# Patient Record
Sex: Male | Born: 1990 | Race: White | Hispanic: No | Marital: Single | Smoking: Never smoker
Health system: Southern US, Community
[De-identification: ages and names within clinical notes are randomized; demographics above are authoritative.]

## PROBLEM LIST (undated history)

## (undated) ENCOUNTER — Emergency Department: Discharge status: Absent without leave | Payer: Managed Care, Other (non HMO)

## (undated) DIAGNOSIS — F419 Anxiety disorder, unspecified: Secondary | ICD-10-CM

## (undated) DIAGNOSIS — I10 Essential (primary) hypertension: Secondary | ICD-10-CM

## (undated) DIAGNOSIS — R52 Pain, unspecified: Secondary | ICD-10-CM

## (undated) DIAGNOSIS — G4730 Sleep apnea, unspecified: Secondary | ICD-10-CM

## (undated) DIAGNOSIS — E669 Obesity, unspecified: Secondary | ICD-10-CM

## (undated) HISTORY — PX: KNEE ARTHROSCOPY: SUR90

## (undated) HISTORY — PX: TYMPANOSTOMY TUBE PLACEMENT: SHX32

---

## 2015-08-11 ENCOUNTER — Encounter: Payer: Self-pay | Admitting: Physician Assistant

## 2015-08-11 ENCOUNTER — Ambulatory Visit: Payer: Self-pay | Admitting: Physician Assistant

## 2015-08-11 VITALS — BP 120/80 | HR 99 | Temp 98.8°F

## 2015-08-11 DIAGNOSIS — J018 Other acute sinusitis: Secondary | ICD-10-CM

## 2015-08-11 MED ORDER — AMOXICILLIN-POT CLAVULANATE 875-125 MG PO TABS: 1.0000 | ORAL_TABLET | Freq: Two times a day (BID) | ORAL | Status: DC

## 2015-08-11 NOTE — Progress Notes (Signed)
S: C/o runny nose and congestion with sore throat and sinus headache for 3 days, ?fever, chills yesterday, none today, denies cp/sob, v/d; mucus is green and thick, cough is sporadic and just started this am, c/o of facial and dental pain.   O: PE: vitals wnl, nad, perrl eomi, normocephalic, tms dull, nasal mucosa red and swollen, throat injected, neck supple no lymph, lungs c t a, cv rrr, neuro intact  A:  Acute sinusitis   P: augmentin  bid x 10d, drink fluids, continue regular meds , use otc meds of choice, return if not improving in 5 days, return earlier if worsening

## 2015-11-28 ENCOUNTER — Emergency Department: Payer: Managed Care, Other (non HMO)

## 2015-11-28 ENCOUNTER — Emergency Department
Admission: EM | Admit: 2015-11-28 | Discharge: 2015-11-29 | Disposition: A | Discharge status: Home / Self Care | Payer: Managed Care, Other (non HMO) | Attending: Emergency Medicine | Admitting: Emergency Medicine

## 2015-11-28 ENCOUNTER — Encounter: Payer: Self-pay | Admitting: Emergency Medicine

## 2015-11-28 DIAGNOSIS — I10 Essential (primary) hypertension: Secondary | ICD-10-CM | POA: Insufficient documentation

## 2015-11-28 DIAGNOSIS — R000 Tachycardia, unspecified: Secondary | ICD-10-CM | POA: Diagnosis present

## 2015-11-28 HISTORY — DX: Essential (primary) hypertension: I10

## 2015-11-28 LAB — CBC WITH DIFFERENTIAL/PLATELET
Basophils Absolute: 0.1 10*3/uL (ref 0–0.1)
Basophils Relative: 0 %
Eosinophils Absolute: 0.1 10*3/uL (ref 0–0.7)
HEMATOCRIT: 44.2 % (ref 40.0–52.0)
Hemoglobin: 14.8 g/dL (ref 13.0–18.0)
LYMPHS ABS: 3.3 10*3/uL (ref 1.0–3.6)
MCH: 29.4 pg (ref 26.0–34.0)
MCHC: 33.4 g/dL (ref 32.0–36.0)
MCV: 88.1 fL (ref 80.0–100.0)
MONO ABS: 0.6 10*3/uL (ref 0.2–1.0)
Monocytes Relative: 5 %
Neutro Abs: 8.1 10*3/uL — ABNORMAL HIGH (ref 1.4–6.5)
Neutrophils Relative %: 67 %
PLATELETS: 259 10*3/uL (ref 150–440)
RBC: 5.01 MIL/uL (ref 4.40–5.90)
RDW: 13.9 % (ref 11.5–14.5)
WBC: 12.1 10*3/uL — ABNORMAL HIGH (ref 3.8–10.6)

## 2015-11-28 LAB — BASIC METABOLIC PANEL
ANION GAP: 10 (ref 5–15)
BUN: 11 mg/dL (ref 6–20)
CALCIUM: 8.9 mg/dL (ref 8.9–10.3)
CHLORIDE: 107 mmol/L (ref 101–111)
CO2: 23 mmol/L (ref 22–32)
Creatinine, Ser: 1.03 mg/dL (ref 0.61–1.24)
GFR calc Af Amer: >60 mL/min (ref >60)
GFR calc non Af Amer: >60 mL/min (ref >60)
GLUCOSE: 167 mg/dL — AB (ref 65–99)
Potassium: 3.7 mmol/L (ref 3.5–5.1)
Sodium: 140 mmol/L (ref 135–145)

## 2015-11-28 LAB — TSH: TSH: 3.411 u[IU]/mL (ref 0.350–4.500)

## 2015-11-28 LAB — TROPONIN I: Troponin I: <0.03 ng/mL (ref <0.031)

## 2015-11-28 MED ORDER — SODIUM CHLORIDE 0.9 % IV BOLUS (SEPSIS)
1000.0000 mL | Freq: Once | INTRAVENOUS | Status: AC
  Administered 2015-11-28: 1000 mL via INTRAVENOUS

## 2015-11-28 MED ORDER — LORAZEPAM 2 MG/ML IJ SOLN
0.5000 mg | Freq: Once | INTRAMUSCULAR | Status: AC
  Administered 2015-11-28: 0.5 mg via INTRAVENOUS
  Filled 2015-11-28: qty 1

## 2015-11-28 NOTE — ED Notes (Signed)
Pt states he is having a rapid heart rate, sweating, headache, denies dizziness, nausea. States his rate elevated  Suddenly, bp elevated

## 2015-11-28 NOTE — ED Provider Notes (Signed)
Time Seen: Approximately *2210  I have reviewed the triage notes  Chief Complaint: Tachycardia   History of Present Illness: Cesar Braun is a 25 y.o. 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 He states he's been working outside and had a very busy day today and then felt sudden onset of a rapid heart rate, sweating, headache and feeling of lightheadedness. He states he has a history of cluster headaches. He also has a history of hypertension and states he has been taking his lisinopril. He denies any cardiac stimulating medication. He denies any heavy caffeine or illicit drugs. He has had a history of panic attacks in the past.   Past Medical History  Diagnosis Date  . Hypertension     There are no active problems to display for this patient.   Past Surgical History  Procedure Laterality Date  . Knee arthroscopy Right     Past Surgical History  Procedure Laterality Date  . Knee arthroscopy Right     Current Outpatient Rx  Name  Route  Sig  Dispense  Refill  . lisinopril (PRINIVIL,ZESTRIL) 10 MG tablet   Oral   Take 10 mg by mouth daily.           Allergies:  Review of patient's allergies indicates no known allergies.  Family History: No family history on file.  Social History: Social History  Substance Use Topics  . Smoking status: Never Smoker   . Smokeless tobacco: None  . Alcohol Use: 4.2 oz/week    0 Standard drinks or equivalent, 7 Cans of beer per week     Review of Systems:   10 point review of systems was performed and was otherwise negative:  Constitutional: No fever Eyes: No visual disturbances ENT: No sore throat, ear pain Cardiac: No chest pain Respiratory: No shortness of breath, wheezing, or stridor Abdomen: No abdominal pain, no vomiting, No diarrhea Endocrine: No weight loss, No night sweats Extremities: No peripheral edema, cyanosis Skin: No rashes, easy bruising Neurologic: No focal weakness, trouble with speech or  swollowing Urologic: No dysuria, Hematuria, or urinary frequency  Physical Exam:  ED Triage Vitals  Enc Vitals Group     BP 11/28/15 2154 159/113 mmHg     Pulse Rate 11/28/15 2154 117     Resp 11/28/15 2154 23     Temp 11/28/15 2154 98.7 F (37.1 C)     Temp Source 11/28/15 2154 Oral     SpO2 11/28/15 2154 97 %     Weight 11/28/15 2154 300 lb (136.079 kg)     Height 11/28/15 2154 5\' 6"  (1.676 m)     Head Cir --      Peak Flow --      Pain Score 11/28/15 2155 0     Pain Loc --      Pain Edu? --      Excl. in GC? --     General: Awake , Alert , and Oriented times 3; GCS 15 Head: Normal cephalic , atraumatic Eyes: Pupils equal , round, reactive to light Nose/Throat: No nasal drainage, patent upper airway without erythema or exudate.  Neck: Supple, Full range of motion, No anterior adenopathy or palpable thyroid masses Lungs: Clear to ascultation without wheezes , rhonchi, or rales Heart: Tachycardic, regular rhythm without murmurs , gallops , or rubs Abdomen: Soft, non tender without rebound, guarding , or rigidity; bowel sounds positive and symmetric in all 4 quadrants. No organomegaly .  Extremities: 2 plus symmetric pulses. No edema, clubbing or cyanosis Neurologic: normal ambulation, Motor symmetric without deficits, sensory intact Skin: warm, dry, no rashes   Labs:   All laboratory work was reviewed including any pertinent negatives or positives listed below:  Labs Reviewed  CBC WITH DIFFERENTIAL/PLATELET - Abnormal; Notable for the following:    WBC 12.1 (*)    Neutro Abs 8.1 (*)    All other components within normal limits  TROPONIN I  BASIC METABOLIC PANEL  TSH    EKG:  ED ECG REPORT I, Jennye Moccasin, the attending physician, personally viewed and interpreted this ECG.  Date: 11/28/2015 EKG Time: 2138 Rate: 119 Rhythm: Sinus tachycardia QRS Axis: normal Intervals: normal ST/T Wave abnormalities: normal Conduction Disturbances: none Narrative  Interpretation: unremarkable Left ventricular hypertrophy No acute ischemic changes    Radiology:    DG Chest 2 View (Final result) Result time: 11/28/15 23:50:45   Final result by Rad Results In Interface (11/28/15 23:50:45)   Narrative:   CLINICAL DATA: Pt states he is having a rapid heart rate, sweating, headache, denies dizziness, nausea. States his rate elevated Suddenly, bp elevated; denies cp shob or cough  EXAM: CHEST 2 VIEW  COMPARISON: 01/19/2011  FINDINGS: The heart size and mediastinal contours are within normal limits. Both lungs are clear. The visualized skeletal structures are unremarkable.  IMPRESSION: No active cardiopulmonary disease.   Electronically Signed By: Burman Nieves M.D. On: 11/28/2015 23:50     I personally reviewed the radiologic studies    ED Course:  Patient showed symptomatic improvement with IV fluid bolus along with low-dose Ativan. Not sure if he was dehydrated or is having anxiety attack but did not appear to have any significant arrhythmias. His cardiac shadow does not show cardiac enlargement. He does not have any findings indicative of Brugada syndrome but does have some minimal criteria for left ventricular hypertrophy. The patient was advised to follow up with his primary physician. He did not have any  syncopal episode.  Assessment:  Sinus tachycardia Hypertension     Plan: * Outpatient management Patient was advised to return immediately if condition worsens. Patient was advised to follow up with their primary care physician or other specialized physicians involved in their outpatient care. The patient and/or family member/power of attorney had laboratory results reviewed at the bedside. All questions and concerns were addressed and appropriate discharge instructions were distributed by the nursing staff.             Jennye Moccasin, MD 11/29/15 [id number]

## 2015-11-29 NOTE — Discharge Instructions (Signed)
Nonspecific Tachycardia Tachycardia is a faster than normal heartbeat (more than 100 beats per minute). In adults, the heart normally beats between 60 and 100 times a minute. A fast heartbeat may be a normal response to exercise or stress. It does not necessarily mean that something is wrong. However, sometimes when your heart beats too fast it may not be able to pump enough blood to the rest of your body. This can result in chest pain, shortness of breath, dizziness, and even fainting. Nonspecific tachycardia means that the specific cause or pattern of your tachycardia is unknown. CAUSES  Tachycardia may be harmless or it may be due to a more serious underlying cause. Possible causes of tachycardia include:  Exercise or exertion.  Fever.  Pain or injury.  Infection.  Loss of body fluids (dehydration).  Overactive thyroid.  Lack of red blood cells (anemia).  Anxiety and stress.  Alcohol.  Caffeine.  Tobacco products.  Diet pills.  Illegal drugs.  Heart disease. SYMPTOMS  Rapid or irregular heartbeat (palpitations).  Suddenly feeling your heart beating (cardiac awareness).  Dizziness.  Tiredness (fatigue).  Shortness of breath.  Chest pain.  Nausea.  Fainting. DIAGNOSIS  Your caregiver will perform a physical exam and take your medical history. In some cases, a heart specialist (cardiologist) may be consulted. Your caregiver may also order:  Blood tests.  Electrocardiography. This test records the electrical activity of your heart.  A heart monitoring test. TREATMENT  Treatment will depend on the likely cause of your tachycardia. The goal is to treat the underlying cause of your tachycardia. Treatment methods may include:  Replacement of fluids or blood through an intravenous (IV) tube for moderate to severe dehydration or anemia.  New medicines or changes in your current medicines.  Diet and lifestyle changes.  Treatment for certain  infections.  Stress relief or relaxation methods. HOME CARE INSTRUCTIONS   Rest.  Drink enough fluids to keep your urine clear or pale yellow.  Do not smoke.  Avoid:  Caffeine.  Tobacco.  Alcohol.  Chocolate.  Stimulants such as over-the-counter diet pills or pills that help you stay awake.  Situations that cause anxiety or stress.  Illegal drugs such as marijuana, phencyclidine (PCP), and cocaine.  Only take medicine as directed by your caregiver.  Keep all follow-up appointments as directed by your caregiver. SEEK IMMEDIATE MEDICAL CARE IF:   You have pain in your chest, upper arms, jaw, or neck.  You become weak, dizzy, or feel faint.  You have palpitations that will not go away.  You vomit, have diarrhea, or pass blood in your stool.  Your skin is cool, pale, and wet.  You have a fever that will not go away with rest, fluids, and medicine. MAKE SURE YOU:   Understand these instructions.  Will watch your condition.  Will get help right away if you are not doing well or get worse.   This information is not intended to replace advice given to you by your health care provider. Make sure you discuss any questions you have with your health care provider.   Document Released: 08/10/2004 Document Revised: 09/25/2011 Document Reviewed: 01/15/2015 Elsevier Interactive Patient Education [employer]2016 [employer].  Please return immediately if condition worsens. Please contact her primary physician or the physician you were given for referral. If you have any specialist physicians involved in her treatment and plan please also contact them. Thank you for using [facility].

## 2015-12-01 ENCOUNTER — Ambulatory Visit: Payer: Self-pay | Admitting: Physician Assistant

## 2016-08-10 DIAGNOSIS — R1032 Left lower quadrant pain: Secondary | ICD-10-CM | POA: Insufficient documentation

## 2016-10-02 ENCOUNTER — Encounter: Payer: Self-pay | Admitting: Physician Assistant

## 2016-10-02 ENCOUNTER — Ambulatory Visit: Payer: Self-pay | Admitting: Physician Assistant

## 2016-10-02 VITALS — BP 121/80 | HR 89 | Temp 97.7°F

## 2016-10-02 DIAGNOSIS — N529 Male erectile dysfunction, unspecified: Secondary | ICD-10-CM

## 2016-10-02 DIAGNOSIS — G4730 Sleep apnea, unspecified: Secondary | ICD-10-CM | POA: Insufficient documentation

## 2016-10-02 DIAGNOSIS — E669 Obesity, unspecified: Secondary | ICD-10-CM | POA: Insufficient documentation

## 2016-10-02 DIAGNOSIS — S76212A Strain of adductor muscle, fascia and tendon of left thigh, initial encounter: Secondary | ICD-10-CM

## 2016-10-02 DIAGNOSIS — I10 Essential (primary) hypertension: Secondary | ICD-10-CM | POA: Insufficient documentation

## 2016-10-02 DIAGNOSIS — F419 Anxiety disorder, unspecified: Secondary | ICD-10-CM | POA: Insufficient documentation

## 2016-10-02 MED ORDER — SILDENAFIL CITRATE 50 MG PO TABS: ORAL_TABLET | ORAL | 2 refills | Status: DC

## 2016-10-02 NOTE — Progress Notes (Signed)
S: c/o groin pain left side, states sometimes feels that there is swelling in the same area, doesn't always hurt, just sometimes with how he sits, no testicular pain, no known injury, also having difficulty maintaining an erection during intercourse, ?if its from anxiety, is really embarrassed by this even though his girlfriend has been supportive, and his ears are clogged up with wax  O: vitals wnl, nad, ear canals + wax at this time, can see a little of the tm, neck supple no lymph, lungs c t a, cv rrr, no hernia noted but inguinal canal is a little tender with testing, testicles wnl, n/v intact, ears irrigated by rma, small amount of wax remaining in canal but am able to see full tm  A: groin strain, no hernia at this time but muscle is weak, cerumen impaction and removal, erectile dysfunction  P: weight loss, core exercises, return if worsening, go to ER if severe pain, sildenifil 50mg  1 or 2 prn sexual intercourse, if this does not help will refer to urology

## 2016-10-03 ENCOUNTER — Emergency Department: Payer: Managed Care, Other (non HMO)

## 2016-10-03 ENCOUNTER — Encounter: Payer: Self-pay | Admitting: Emergency Medicine

## 2016-10-03 ENCOUNTER — Emergency Department
Admission: EM | Admit: 2016-10-03 | Discharge: 2016-10-03 | Disposition: A | Discharge status: Home / Self Care | Payer: Managed Care, Other (non HMO) | Attending: Emergency Medicine | Admitting: Emergency Medicine

## 2016-10-03 DIAGNOSIS — N50819 Testicular pain, unspecified: Secondary | ICD-10-CM

## 2016-10-03 DIAGNOSIS — I10 Essential (primary) hypertension: Secondary | ICD-10-CM | POA: Insufficient documentation

## 2016-10-03 DIAGNOSIS — N50812 Left testicular pain: Secondary | ICD-10-CM | POA: Diagnosis not present

## 2016-10-03 DIAGNOSIS — R1032 Left lower quadrant pain: Secondary | ICD-10-CM

## 2016-10-03 LAB — URINALYSIS, COMPLETE (UACMP) WITH MICROSCOPIC
Bacteria, UA: NONE SEEN
Bilirubin Urine: NEGATIVE
GLUCOSE, UA: NEGATIVE mg/dL
Hgb urine dipstick: NEGATIVE
Ketones, ur: NEGATIVE mg/dL
Leukocytes, UA: NEGATIVE
Nitrite: NEGATIVE
PH: 7 (ref 5.0–8.0)
Protein, ur: NEGATIVE mg/dL
RBC / HPF: NONE SEEN RBC/hpf (ref 0–5)
Specific Gravity, Urine: 1.017 (ref 1.005–1.030)
WBC, UA: NONE SEEN WBC/hpf (ref 0–5)

## 2016-10-03 LAB — CBC WITH DIFFERENTIAL/PLATELET
Basophils Absolute: 0.1 10*3/uL (ref 0–0.1)
Basophils Relative: 1 %
Eosinophils Absolute: 0.1 10*3/uL (ref 0–0.7)
Eosinophils Relative: 1 %
HEMATOCRIT: 43.1 % (ref 40.0–52.0)
Hemoglobin: 14.8 g/dL (ref 13.0–18.0)
LYMPHS PCT: 27 %
Lymphs Abs: 3.8 10*3/uL — ABNORMAL HIGH (ref 1.0–3.6)
MCH: 29.9 pg (ref 26.0–34.0)
MCHC: 34.3 g/dL (ref 32.0–36.0)
MCV: 87.2 fL (ref 80.0–100.0)
MONO ABS: 0.7 10*3/uL (ref 0.2–1.0)
MONOS PCT: 5 %
NEUTROS ABS: 9.6 10*3/uL — AB (ref 1.4–6.5)
Neutrophils Relative %: 66 %
Platelets: 283 10*3/uL (ref 150–440)
RBC: 4.94 MIL/uL (ref 4.40–5.90)
RDW: 14.2 % (ref 11.5–14.5)
WBC: 14.3 10*3/uL — ABNORMAL HIGH (ref 3.8–10.6)

## 2016-10-03 LAB — COMPREHENSIVE METABOLIC PANEL
ALT: 35 U/L (ref 17–63)
AST: 26 U/L (ref 15–41)
Albumin: 4.1 g/dL (ref 3.5–5.0)
Alkaline Phosphatase: 77 U/L (ref 38–126)
Anion gap: 8 (ref 5–15)
BILIRUBIN TOTAL: 1.1 mg/dL (ref 0.3–1.2)
BUN: 10 mg/dL (ref 6–20)
CHLORIDE: 102 mmol/L (ref 101–111)
CO2: 27 mmol/L (ref 22–32)
CREATININE: 0.81 mg/dL (ref 0.61–1.24)
Calcium: 9.1 mg/dL (ref 8.9–10.3)
GLUCOSE: 81 mg/dL (ref 65–99)
Potassium: 3.5 mmol/L (ref 3.5–5.1)
Sodium: 137 mmol/L (ref 135–145)
Total Protein: 7.4 g/dL (ref 6.5–8.1)

## 2016-10-03 LAB — LIPASE, BLOOD: LIPASE: 15 U/L (ref 11–51)

## 2016-10-03 MED ORDER — TRAMADOL HCL 50 MG PO TABS: 50.0000 mg | ORAL_TABLET | Freq: Four times a day (QID) | ORAL | 0 refills | Status: DC | PRN

## 2016-10-03 MED ORDER — IOPAMIDOL (ISOVUE-300) INJECTION 61%
100.0000 mL | Freq: Once | INTRAVENOUS | Status: AC | PRN
  Administered 2016-10-03: 125 mL via INTRAVENOUS
  Filled 2016-10-03: qty 100

## 2016-10-03 MED ORDER — IOPAMIDOL (ISOVUE-300) INJECTION 61%
30.0000 mL | Freq: Once | INTRAVENOUS | Status: AC
  Administered 2016-10-03: 30 mL via ORAL
  Filled 2016-10-03: qty 30

## 2016-10-03 MED ORDER — SODIUM CHLORIDE 0.9 % IV BOLUS (SEPSIS)
1000.0000 mL | Freq: Once | INTRAVENOUS | Status: AC
  Administered 2016-10-03: 1000 mL via INTRAVENOUS

## 2016-10-03 NOTE — ED Notes (Signed)
Patient transported to Ultrasound 

## 2016-10-03 NOTE — ED Triage Notes (Signed)
Pt presents to ED with right sided groin pain; ongoing for about 2 weeks but worse today. [facility] told to come to ED for further evaluation to rule out an strangulated hernia. Denies difficulty urinating or having a bowel movement. Painful with palpation.

## 2016-10-03 NOTE — ED Notes (Signed)
Patient transported to CT 

## 2016-10-03 NOTE — ED Provider Notes (Addendum)
[facility] Emergency Department Provider Note  ____________________________________________   First MD Initiated Contact with Patient 10/03/16 1943     (approximate)  I have reviewed the triage vital signs and the nursing notes.   HISTORY  Chief Complaint Groin Pain   HPI Cesar Braun is a 26 y.o. male With a history of hypertension who is presenting to the emergency department today with left testicular pain was radiating into the left groin. He says that the pain has been ongoing for several weeks and has been associated with the left testicle being high riding. 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 he denies any pain with urination. Says the pain as a 6-7 out of 10 at this time. Denies any nausea vomiting or diarrhea. Says that he has been eating and drinking normally at home over the past several weeks. Denies any trauma to the left testicle. Patient denies any penile discharge. Currently sexually active with one partner.   Past Medical History:  Diagnosis Date  . Hypertension     Patient Active Problem List   Diagnosis Date Noted  . Anxiety 10/02/2016  . HTN (hypertension) 10/02/2016  . Obesity 10/02/2016  . Sleep apnea 10/02/2016  . Abdominal pain, LLQ (left lower quadrant) 08/10/2016    Past Surgical History:  Procedure Laterality Date  . KNEE ARTHROSCOPY Right     Prior to Admission medications   Medication Sig Start Date End Date Taking? Authorizing Provider  Cholecalciferol (VITAMIN D3) 2000 units capsule Take by mouth.    Historical Provider, MD  lisinopril (PRINIVIL,ZESTRIL) 10 MG tablet Take 10 mg by mouth daily.    Historical Provider, MD  sildenafil (VIAGRA) 50 MG tablet Take one or 2 pills prn erectile dysfunction 10/02/16   Faythe Ghee, PA-C    Allergies Patient has no known allergies.  No family history on  file.  Social History Social History  Substance Use Topics  . Smoking status: Never Smoker  . Smokeless tobacco: Never Used  . Alcohol use 4.2 oz/week    7 Cans of beer per week    Review of Systems Constitutional: No fever/chills Eyes: No visual changes. ENT: No sore throat. Cardiovascular: Denies chest pain. Respiratory: Denies shortness of breath. Gastrointestinal: No abdominal pain.  No nausea, no vomiting.  No diarrhea.  No constipation. Genitourinary: Negative for dysuria. Musculoskeletal: Negative for back pain. Skin: Negative for rash. Neurological: Negative for headaches, focal weakness or numbness.  10-point ROS otherwise negative.  ____________________________________________   PHYSICAL EXAM:  VITAL SIGNS: ED Triage Vitals  Enc Vitals Group     BP 10/03/16 1931 (!) 143/90     Pulse Rate 10/03/16 1931 (!) 113     Resp 10/03/16 1931 18     Temp 10/03/16 1931 98.4 F (36.9 C)     Temp Source 10/03/16 1931 Oral     SpO2 10/03/16 1931 100 %     Weight 10/03/16 1931 300 lb (136.1 kg)     Height 10/03/16 1931 5\' 7"  (1.702 m)     Head Circumference --      Peak Flow --      Pain Score 10/03/16 1932 7     Pain Loc --      Pain Edu? --      Excl. in GC? --     Constitutional: Alert and oriented. Well appearing  and in no acute distress. Eyes: Conjunctivae are normal. PERRL. EOMI. Head: Atraumatic. Nose: No congestion/rhinnorhea. Mouth/Throat: Mucous membranes are moist.  Neck: No stridor.   Cardiovascular: Normal rate, regular rhythm. Grossly normal heart sounds.   Respiratory: Normal respiratory effort.  No retractions. Lungs CTAB. Gastrointestinal: Soft and nontender. No distention.  Genitourinary:  Patient with high riding left testicle but without any definitive horizontal lie of the testicle. Grossly, there are no masses visualized. Circumcised male with normal glands. Also tenderness over the spermatic cord and theleft epididymis.no hernia sacs  palpated. Musculoskeletal: No lower extremity tenderness nor edema.  No joint effusions. Neurologic:  Normal speech and language. No gross focal neurologic deficits are appreciated.  Skin:  Skin is warm, dry and intact. No rash noted. Psychiatric: Mood and affect are normal. Speech and behavior are normal.  ____________________________________________   LABS (all labs ordered are listed, but only abnormal results are displayed)  Labs Reviewed  URINALYSIS, COMPLETE (UACMP) WITH MICROSCOPIC - Abnormal; Notable for the following:       Result Value   Color, Urine YELLOW (*)    APPearance CLEAR (*)    Squamous Epithelial / LPF 0-5 (*)    All other components within normal limits  CBC WITH DIFFERENTIAL/PLATELET - Abnormal; Notable for the following:    WBC 14.3 (*)    Neutro Abs 9.6 (*)    Lymphs Abs 3.8 (*)    All other components within normal limits  COMPREHENSIVE METABOLIC PANEL  LIPASE, BLOOD   ____________________________________________  EKG   ____________________________________________  RADIOLOGY  Pending CAT scan of the abdomen and pelvis.  US Scrotum (Final result)  Result time 10/03/16 20:58:58  Final result by Adrian ProwsKim M Fujinaga, MD (10/03/16 20:58:58)           Narrative:   CLINICAL DATA: Left testicular pain  EXAM: SCROTAL ULTRASOUND  DOPPLER ULTRASOUND OF THE TESTICLES  TECHNIQUE: Complete ultrasound examination of the testicles, epididymis, and other scrotal structures was performed. Color and spectral Doppler ultrasound were also utilized to evaluate blood flow to the testicles.  COMPARISON: None.  FINDINGS: Right testicle  Measurements: 4.1 x 2.3 x 2.9 cm. No mass or microlithiasis visualized.  Left testicle  Measurements: 4 x 2.3 x 2.8 cm. No mass or microlithiasis visualized. Calcifications at the periphery of the left testis possibly related to remote infection or trauma.  Right epididymis: Tiny cyst measuring 1.5 x 2 x 1.4  cm  Left epididymis: Tiny cyst measuring 3.5 by 5.5 x 3.6 mm  Hydrocele: None visualized.  Varicocele: None visualized.  Pulsed Doppler interrogation of both testes demonstrates normal low resistance arterial and venous waveforms bilaterally.  IMPRESSION: No sonographic evidence for testicular torsion. Tiny epididymal head cysts.   Electronically Signed By: Jasmine PangKim Fujinaga M.D. On: 10/03/2016 20:58            ____________________________________________   PROCEDURES  Procedure(s) performed:   Procedures  Critical Care performed:   ____________________________________________   INITIAL IMPRESSION / ASSESSMENT AND PLAN / ED COURSE  Pertinent labs & imaging results that were available during my care of the patient were reviewed by me and considered in my medical decision making (see chart for details). ----------------------------------------- 8 PM on 10/03/2016 ----------------------------------------- Discussed case with Dr. Mena GoesEskridge of urology who would like to see scrotal ultrasound before any further action.  ----------------------------------------- 10:19 PM on 10/03/2016 -----------------------------------------  Urinalysis as well as ultrasound of the scrotum is reassuring. Patient says that his pain is now decreased and is just "uncomfortable."  He says this is the level of pain that he has had for the past several weeks. We will proceed with CAT scan to investigate any hernia that was not detected on the ultrasounds as an abdominal hernia. Patient said he recently took 1 dose of Viagra several days ago for rectal dysfunction but does not have any pain with erections.  Signed out to Dr. Alphonzo Lemmings.      ____________________________________________   FINAL CLINICAL IMPRESSION(S) / ED DIAGNOSES  Final diagnoses:  Pain in left testicle      NEW MEDICATIONS STARTED DURING THIS VISIT:  New Prescriptions   No medications on file     Note:   This document was prepared using Dragon voice recognition software and may include unintentional dictation errors.    Myrna Blazer, MD 10/03/16 1610    Myrna Blazer, MD 10/03/16 2222

## 2016-10-03 NOTE — ED Provider Notes (Signed)
-----------------------------------------   11:38 PM on 10/03/2016 -----------------------------------------   Blood pressure (!) 143/90, pulse (!) 113, temperature 98.4 F (36.9 C), temperature source Oral, resp. rate 18, height 5\' 7"  (1.702 m), weight 300 lb (136.1 kg), SpO2 100 %.  Assuming care from Dr. Alphonzo LemmingsMcShane.  In short, Cesar Braun is a 26 y.o. male with a chief complaint of Groin Pain .  Refer to the original H&P for additional details.  The current plan of care is to follow up the US and the CT scan.   Clinical Course as of Oct 03 2336  Tue Oct 03, 2016  2309 No sonographic evidence for testicular torsion. Tiny epididymal head cysts.   US Scrotum [AW]  2310 Testes symmetric and unremarkable in appearance. Inguinal canals are unremarkable.   CT Abdomen Pelvis W Contrast [AW]    Clinical Course User Index [AW] Rebecka ApleyAllison P Bryanna Yim, MD   The patient's ultrasound showed some tiny epididymal head cyst and the CT is unremarkable. I will discharge the patient to have him follow-up with urology which had been discussed previously. The patient be discharged home.   Rebecka ApleyAllison P Yudit Modesitt, MD 10/03/16 [phone]

## 2016-10-24 ENCOUNTER — Encounter: Payer: Self-pay | Admitting: *Deleted

## 2016-10-25 ENCOUNTER — Encounter
Admission: RE | Disposition: A | Discharge status: Home / Self Care | Payer: Self-pay | Source: Ambulatory Visit | Attending: Unknown Physician Specialty

## 2016-10-25 ENCOUNTER — Ambulatory Visit
Admission: RE | Admit: 2016-10-25 | Discharge: 2016-10-25 | Disposition: A | Discharge status: Home / Self Care | Payer: Managed Care, Other (non HMO) | Source: Ambulatory Visit | Attending: Unknown Physician Specialty | Admitting: Unknown Physician Specialty

## 2016-10-25 ENCOUNTER — Ambulatory Visit: Payer: Managed Care, Other (non HMO) | Admitting: Anesthesiology

## 2016-10-25 ENCOUNTER — Encounter: Payer: Self-pay | Admitting: *Deleted

## 2016-10-25 DIAGNOSIS — K648 Other hemorrhoids: Secondary | ICD-10-CM | POA: Diagnosis not present

## 2016-10-25 DIAGNOSIS — K9289 Other specified diseases of the digestive system: Secondary | ICD-10-CM | POA: Diagnosis not present

## 2016-10-25 DIAGNOSIS — Z79899 Other long term (current) drug therapy: Secondary | ICD-10-CM | POA: Insufficient documentation

## 2016-10-25 DIAGNOSIS — I10 Essential (primary) hypertension: Secondary | ICD-10-CM | POA: Insufficient documentation

## 2016-10-25 DIAGNOSIS — G4730 Sleep apnea, unspecified: Secondary | ICD-10-CM | POA: Insufficient documentation

## 2016-10-25 DIAGNOSIS — R109 Unspecified abdominal pain: Secondary | ICD-10-CM | POA: Diagnosis present

## 2016-10-25 DIAGNOSIS — Z6841 Body Mass Index (BMI) 40.0 and over, adult: Secondary | ICD-10-CM | POA: Insufficient documentation

## 2016-10-25 DIAGNOSIS — R1032 Left lower quadrant pain: Secondary | ICD-10-CM | POA: Insufficient documentation

## 2016-10-25 HISTORY — DX: Sleep apnea, unspecified: G47.30

## 2016-10-25 HISTORY — DX: Obesity, unspecified: E66.9

## 2016-10-25 HISTORY — DX: Anxiety disorder, unspecified: F41.9

## 2016-10-25 HISTORY — DX: Pain, unspecified: R52

## 2016-10-25 SURGERY — SIGMOIDOSCOPY, FLEXIBLE
Anesthesia: General

## 2016-10-25 MED ORDER — PROPOFOL 500 MG/50ML IV EMUL
INTRAVENOUS | Status: AC
  Filled 2016-10-25: qty 50

## 2016-10-25 MED ORDER — LIDOCAINE HCL (PF) 1 % IJ SOLN
INTRAMUSCULAR | Status: AC
  Filled 2016-10-25: qty 2

## 2016-10-25 MED ORDER — SODIUM CHLORIDE 0.9 % IV SOLN: INTRAVENOUS | Status: DC

## 2016-10-25 MED ORDER — MIDAZOLAM HCL 2 MG/2ML IJ SOLN
INTRAMUSCULAR | Status: AC
Start: 2016-10-25 — End: 2016-10-25
  Filled 2016-10-25: qty 2

## 2016-10-25 MED ORDER — SODIUM CHLORIDE 0.9 % IV SOLN
INTRAVENOUS | Status: DC | PRN
  Administered 2016-10-25: 13:00:00 via INTRAVENOUS

## 2016-10-25 MED ORDER — FENTANYL CITRATE (PF) 100 MCG/2ML IJ SOLN
INTRAMUSCULAR | Status: AC
  Filled 2016-10-25: qty 2

## 2016-10-25 NOTE — Transfer of Care (Signed)
Immediate Anesthesia Transfer of Care Note  Patient: Cesar Braun  Procedure(s) Performed: Procedure(s): COLONOSCOPY WITH PROPOFOL (N/A)  Patient Location: Endoscopy Unit  Anesthesia Type:None  Level of Consciousness: awake, alert  and oriented  Airway & Oxygen Therapy: Patient Spontanous Breathing and Patient connected to nasal cannula oxygen  Post-op Assessment: Report given to RN  Post vital signs: Reviewed and stable  Last Vitals:  Vitals:   10/25/16 1257  BP: (!) 145/106  Resp: 18  Temp: 37.2 C    Last Pain:  Vitals:   10/25/16 1257  TempSrc: Tympanic         Complications: No apparent anesthesia complications

## 2016-10-25 NOTE — Anesthesia Postprocedure Evaluation (Signed)
Anesthesia Post Note  Patient: Cesar Braun  Procedure(s) Performed: Procedure(s) (LRB): COLONOSCOPY WITH PROPOFOL (N/A)  Patient location during evaluation: A-ICU Anesthesia plan: No anesthesia given. Level of consciousness: awake and alert Pain management: pain level controlled Vital Signs Assessment: post-procedure vital signs reviewed and stable Respiratory status: spontaneous breathing, nonlabored ventilation, respiratory function stable and patient connected to nasal cannula oxygen Cardiovascular status: stable Postop Assessment: no signs of nausea or vomiting Anesthetic complications: no     Last Vitals:  Vitals:   10/25/16 1257  BP: (!) 145/106  Resp: 18  Temp: 37.2 C    Last Pain:  Vitals:   10/25/16 1257  TempSrc: Tympanic                 Lenard Simmer

## 2016-10-25 NOTE — H&P (Signed)
   Primary Care Physician:  No PCP Per Patient Primary Gastroenterologist:  Dr. Mechele Collin  Pre-Procedure History & Physical: HPI:  Cesar Braun is a 26 y.o. male is here for an flexible sigmoidoscopy.   Past Medical History:  Diagnosis Date  . Anxiety   . Hypertension   . Obesity   . Pain    llq  . Sleep apnea     Past Surgical History:  Procedure Laterality Date  . KNEE ARTHROSCOPY Right   . TYMPANOSTOMY TUBE PLACEMENT Bilateral    very young    Prior to Admission medications   Medication Sig Start Date End Date Taking? Authorizing Provider  Cholecalciferol (VITAMIN D3) 2000 units capsule Take by mouth.   Yes Historical Provider, MD  cyanocobalamin 500 MCG tablet Take 1,000 mcg by mouth daily.   Yes Historical Provider, MD  lisinopril (PRINIVIL,ZESTRIL) 10 MG tablet Take 10 mg by mouth daily.   Yes Historical Provider, MD  sildenafil (VIAGRA) 50 MG tablet Take one or 2 pills prn erectile dysfunction 10/02/16  Yes Faythe Ghee, PA-C  traMADol (ULTRAM) 50 MG tablet Take 1 tablet (50 mg total) by mouth every 6 (six) hours as needed. Patient not taking: Reported on 10/25/2016 10/03/16   Rebecka Apley, MD    Allergies as of 09/07/2016  . (No Known Allergies)    History reviewed. No pertinent family history.  Social History   Social History  . Marital status: Single    Spouse name: N/A  . Number of children: N/A  . Years of education: N/A   Occupational History  . Not on file.   Social History Main Topics  . Smoking status: Never Smoker  . Smokeless tobacco: Never Used  . Alcohol use 4.2 oz/week    7 Cans of beer per week  . Drug use: No  . Sexual activity: Not on file   Other Topics Concern  . Not on file   Social History Narrative  . No narrative on file    Review of Systems: See HPI, otherwise negative ROS  Physical Exam: BP 127/83   Temp 98.9 F (37.2 C) (Tympanic)   Resp 18   Ht  (1.727 m)   Wt 133.8 kg (295 lb)   SpO2 100%   BMI  44.85 kg/m  General:   Alert,  pleasant and cooperative in NAD Head:  Normocephalic and atraumatic. Neck:  Supple; no masses or thyromegaly. Lungs:  Clear throughout to auscultation.    Heart:  Regular rate and rhythm. Abdomen:  Soft, nontender and nondistended. Normal bowel sounds, without guarding, and without rebound.   Neurologic:  Alert and  oriented x4;  grossly normal neurologically.  Impression/Plan: Cesar Braun is here for an flexible sigmoidoscopy to be performed for abdominal pain.  Risks, benefits, limitations, and alternatives regarding  flexible sigmoidoscopy have been reviewed with the patient.  Questions have been answered.  All parties agreeable.   Lynnae Prude, MD  10/25/2016, 4:18 PM

## 2016-10-25 NOTE — Brief Op Note (Signed)
Because of lack of sedation, colonoscopy complete to 50 cms.

## 2016-10-25 NOTE — Brief Op Note (Signed)
Unable to get IV. Karenz inserted 22 in Left AC, but infiltrated upon entry into procedure room. Colonoscopy performed without sedation. Pt. Tolerated well.

## 2016-10-25 NOTE — Op Note (Signed)
[facility] Patient Name: Cesar Braun Procedure Date: 10/25/2016 1:05 PM MRN: [id number] Account #: [id number] Date of Birth: 07-10-1991 Admit Type: Outpatient Age: 26 Room: [facility] ENDO ROOM 3 Gender: Male Note Status: Finalized Procedure:            Colonoscopy Indications:          Abdominal pain in the left lower quadrant Providers:            Scot Jun, MD Complications:        No immediate complications. Procedure:            Pre-Anesthesia Assessment:                       - After reviewing the risks and benefits, the patient                        was deemed in satisfactory condition to undergo the                        procedure.                       After obtaining informed consent, the colonoscope was                        passed under direct vision. Throughout the procedure,                        the patient's blood pressure, pulse, and oxygen                        saturations were monitored continuously. The                        Colonoscope was introduced through the anus and                        advanced to the the sigmoid colon for evaluation. This                        was the intended extent. The colonoscopy was performed                        without difficulty. The patient tolerated the procedure                        well. The quality of the bowel preparation was                        excellent. Findings:      Scope advanced to 50cm in proximal sigmoid colon showing normal mucosa.       At 20cm was a tiny polyp like bump which I removed with jumbo forceps.       Internal hemorrhoids seen. Mucosa throughout the colon looked pink and       healthy. Rectal exam done before the exam was normal.      The perianal and digital rectal examinations were normal. Pertinent       negatives include normal sphincter tone. Impression:           - No specimens collected. Recommendation:       -  Await pathology  results. Scot Jun, MD 10/25/2016 2:08:49 PM This report has been signed electronically. Number of Addenda: 0 Note Initiated On: 10/25/2016 1:05 PM Total Procedure Duration: 0 hours 5 minutes 18 seconds       [facility]

## 2016-10-25 NOTE — Anesthesia Post-op Follow-up Note (Signed)
Anesthesia QCDR form completed.        

## 2016-10-25 NOTE — Anesthesia Preprocedure Evaluation (Addendum)
Anesthesia Evaluation  Patient identified by MRN, date of birth, ID band Patient awake    Reviewed: Allergy & Precautions, H&P , NPO status , Patient's Chart, lab work & pertinent test results, reviewed documented beta blocker date and time   History of Anesthesia Complications Negative for: history of anesthetic complications  Airway Mallampati: III  TM Distance: >3 FB Neck ROM: full    Dental  (+) Teeth Intact, Dental Advidsory Given   Pulmonary neg shortness of breath, sleep apnea , neg COPD, neg recent URI,           Cardiovascular Exercise Tolerance: Good hypertension, (-) angina(-) CAD, (-) Past MI, (-) Cardiac Stents and (-) CABG (-) dysrhythmias (-) Valvular Problems/Murmurs     Neuro/Psych negative neurological ROS  negative psych ROS   GI/Hepatic Neg liver ROS, GERD  ,  Endo/Other  neg diabetesMorbid obesity  Renal/GU negative Renal ROS  negative genitourinary   Musculoskeletal   Abdominal   Peds  Hematology negative hematology ROS (+)   Anesthesia Other Findings Past Medical History: No date: Anxiety No date: Hypertension No date: Obesity No date: Pain     Comment: llq No date: Sleep apnea   Reproductive/Obstetrics negative OB ROS                            Anesthesia Physical Anesthesia Plan  ASA: III  Anesthesia Plan: General   Post-op Pain Management:    Induction:   Airway Management Planned:   Additional Equipment:   Intra-op Plan:   Post-operative Plan:   Informed Consent: I have reviewed the patients History and Physical, chart, labs and discussed the procedure including the risks, benefits and alternatives for the proposed anesthesia with the patient or authorized representative who has indicated his/her understanding and acceptance.   Dental Advisory Given  Plan Discussed with: Anesthesiologist, CRNA and Surgeon  Anesthesia Plan Comments:          Anesthesia Quick Evaluation

## 2016-10-27 LAB — SURGICAL PATHOLOGY

## 2017-05-25 ENCOUNTER — Ambulatory Visit: Payer: Self-pay | Admitting: Physician Assistant

## 2017-05-25 ENCOUNTER — Encounter: Payer: Self-pay | Admitting: Physician Assistant

## 2017-05-25 VITALS — BP 140/100 | HR 99 | Temp 99.0°F

## 2017-05-25 DIAGNOSIS — R21 Rash and other nonspecific skin eruption: Secondary | ICD-10-CM

## 2017-05-25 DIAGNOSIS — L03112 Cellulitis of left axilla: Secondary | ICD-10-CM

## 2017-05-25 MED ORDER — DEXAMETHASONE SODIUM PHOSPHATE 10 MG/ML IJ SOLN
10.0000 mg | Freq: Once | INTRAMUSCULAR | Status: AC
Start: 2017-05-25 — End: 2017-05-25
  Administered 2017-05-25: 10 mg via INTRAMUSCULAR

## 2017-05-25 MED ORDER — CLINDAMYCIN HCL 300 MG PO CAPS: 300.0000 mg | ORAL_CAPSULE | Freq: Three times a day (TID) | ORAL | 0 refills | Status: DC

## 2017-05-25 NOTE — Progress Notes (Signed)
S: Patient complains of a red area under his left arm. He thought it was due to using old spice deodorant. It has had a sticky greenish drainage for several days. The drainage has dried up at this time. The area still very red and sore. Now he has a few bumps spreading onto his chest and saw one on his right forearm. Said he felt cold last night but doesn't know if he had a fever. Does have a sore throat. No cough or congestion. States he does have a little sinus drainage.  O: Vitals show a low-grade temp of 99. Other vitals are normal. Throat is a little irritated on the left side. Neck is supple. Lungs are clear to auscultation. Heart sounds are normal. Skin in the left axilla is red, inflamed, and wrinkled appearing, some small red bumps spreading towards the chest adjacent to the red inflamed area, one small red bump on the right forearm. No active drainage at this time.  A: cellulitis, common cold  P: decadron 10 mg IM, clindamycin 300mg  tid x 7d, pt to recheck on Monday at 8 am

## 2017-05-28 ENCOUNTER — Ambulatory Visit: Payer: Self-pay | Admitting: Physician Assistant

## 2017-05-28 ENCOUNTER — Encounter: Payer: Self-pay | Admitting: Physician Assistant

## 2017-05-28 VITALS — BP 120/90 | HR 105 | Temp 98.2°F | Resp 16

## 2017-05-28 DIAGNOSIS — L03112 Cellulitis of left axilla: Secondary | ICD-10-CM

## 2017-05-28 DIAGNOSIS — J069 Acute upper respiratory infection, unspecified: Secondary | ICD-10-CM

## 2017-05-28 MED ORDER — ALBUTEROL SULFATE HFA 108 (90 BASE) MCG/ACT IN AERS: 2.0000 | INHALATION_SPRAY | Freq: Four times a day (QID) | RESPIRATORY_TRACT | 0 refills | Status: DC | PRN

## 2017-05-28 MED ORDER — METHYLPREDNISOLONE 4 MG PO TBPK: ORAL_TABLET | ORAL | 0 refills | Status: DC

## 2017-05-28 NOTE — Progress Notes (Signed)
S: Patient is here for recheck of cellulitis in the left axilla, states he has taken the medication as prescribed, also states he started with a harsh cough, fever and chills, and body aches over the weekend. States he is coughing up green mucus. He also states he is having wheezing. Denies chest pain or shortness of breath. Did have to call in to work yesterday  O: Vitals with elevated pulse, patient is afebrile at this time, ENT is normal, lungs with decreased breath sounds, CV RRR, cough is dry and hacking, left axilla shows no redness or swelling, cellulitis has resolved.  A: Resolved cellulitis of the left axilla, acute URI  P: Continue the clindamycin, patient instructed to eat yogurt, prescription for Medrol Dosepak and albuterol inhaler sent to the pharmacy, note for work for 11/11 and 05/28/17 given

## 2017-08-30 IMAGING — CT CT ABD-PELV W/ CM
2 of 7 series · 15 of 46 positions shown, 17 images · IV contrast (APPLIED)
Comparison: CT of the abdomen and pelvis from 04/14/2016, and
scrotal ultrasound performed earlier today at [DATE] p.m.

CLINICAL DATA: Acute onset of left testicular pain, radiating to
the left groin. Initial encounter.

EXAM:
CT ABDOMEN AND PELVIS WITH CONTRAST
TECHNIQUE: Multidetector CT imaging of the abdomen and pelvis was performed
using the standard protocol following bolus administration of
intravenous contrast.
CONTRAST:  125mL CE1VDX-033 IOPAMIDOL (CE1VDX-033) INJECTION 61%

[Series 2: axial st · axial · 0.98mm/px · z∈[-412,+138]mm · 12 of 122 slices shown, 14 images]
[im 6/122  soft-tissue]
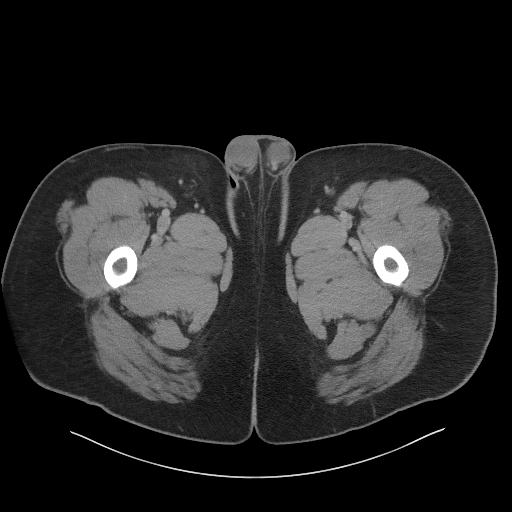
[im 6/122  bone]
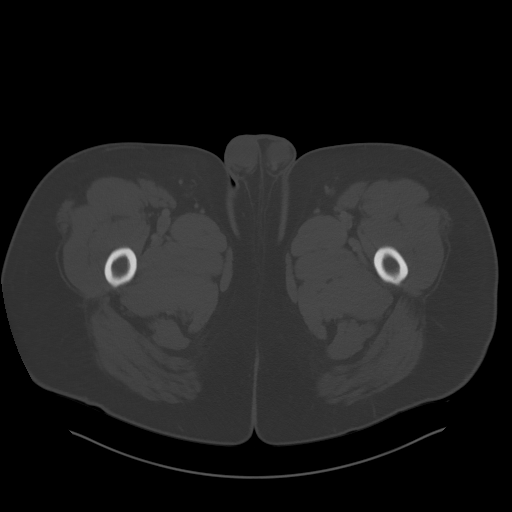
[im 18/122  soft-tissue]
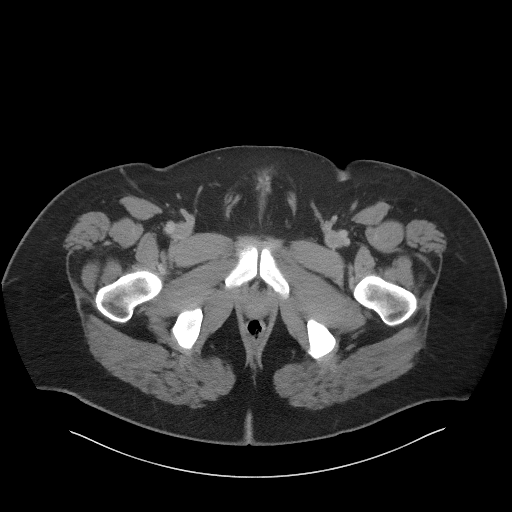
[im 29/122  soft-tissue]
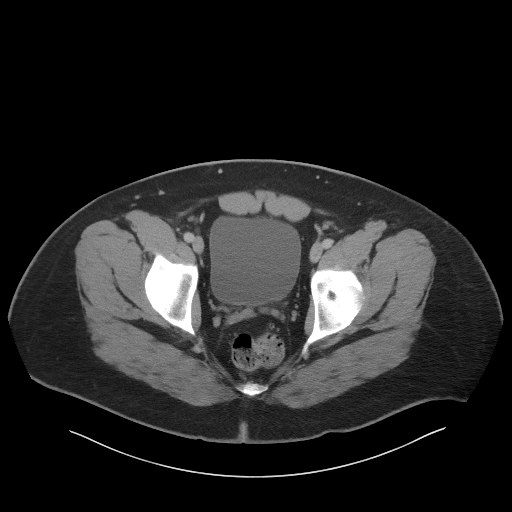
[im 35/122  soft-tissue]
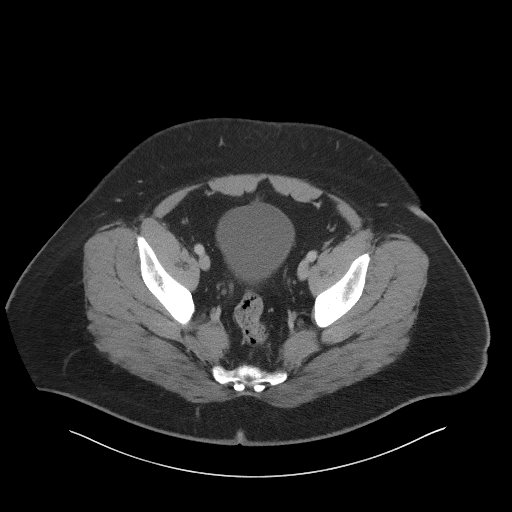
[im 47/122  soft-tissue]
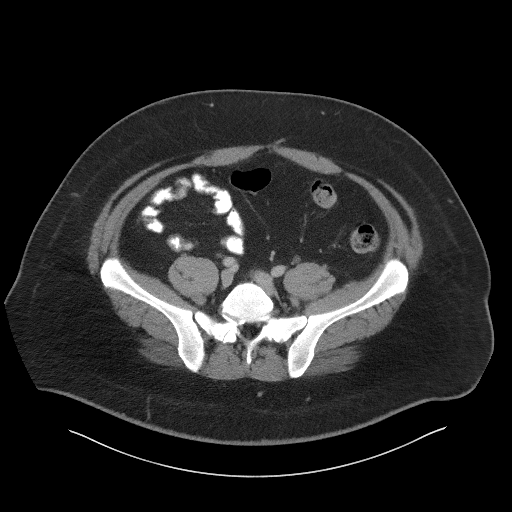
[im 58/122  soft-tissue]
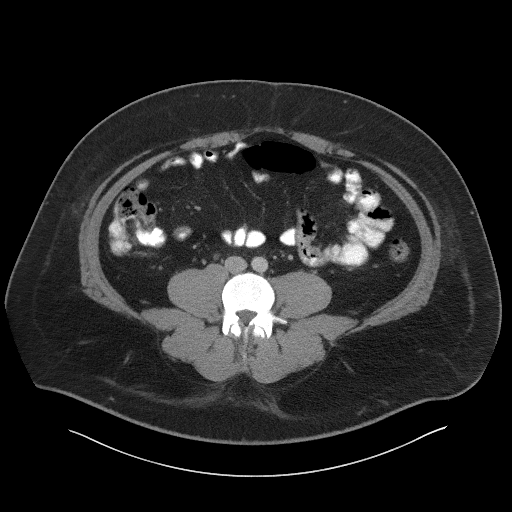
[im 64/122  soft-tissue]
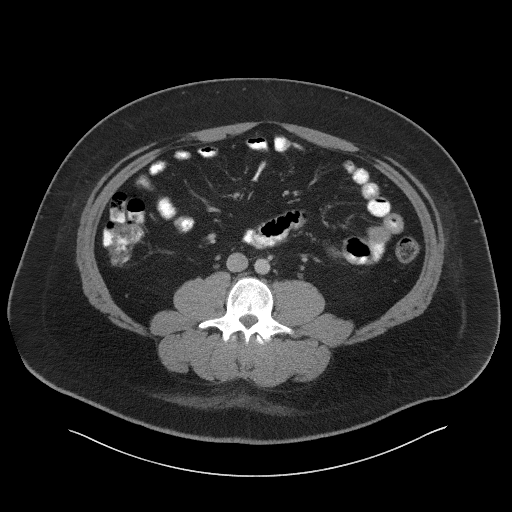
[im 75/122  soft-tissue]
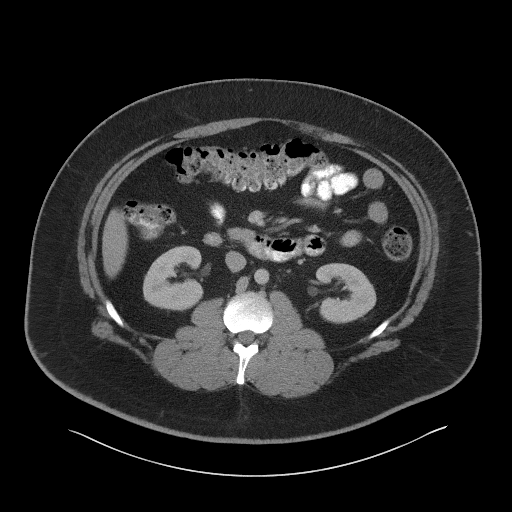
[im 87/122  soft-tissue]
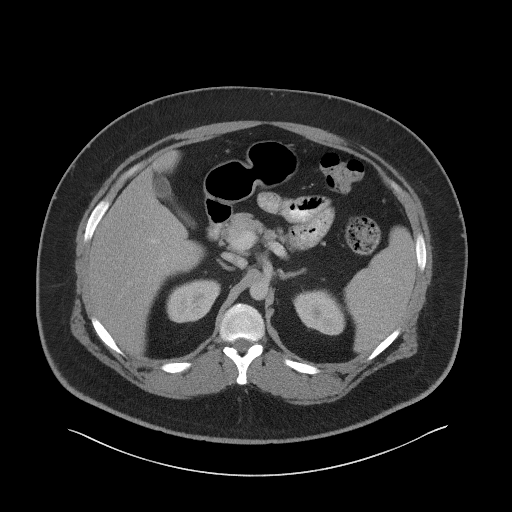
[im 87/122  bone]
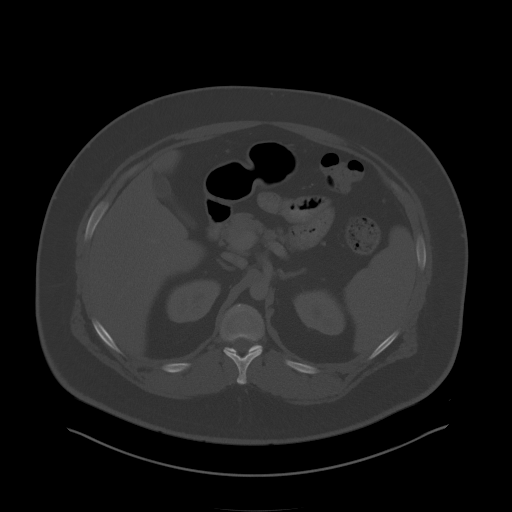
[im 93/122  soft-tissue]
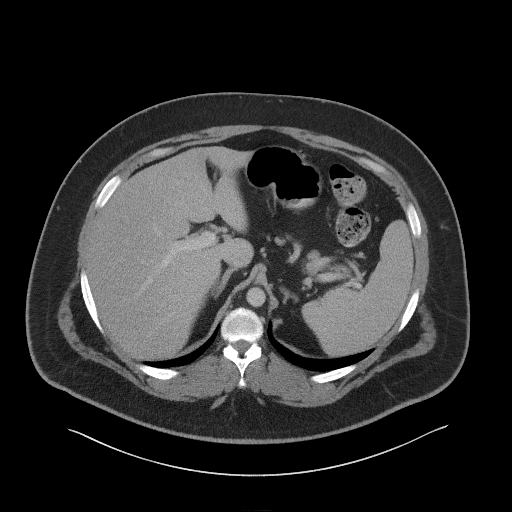
[im 104/122  soft-tissue]
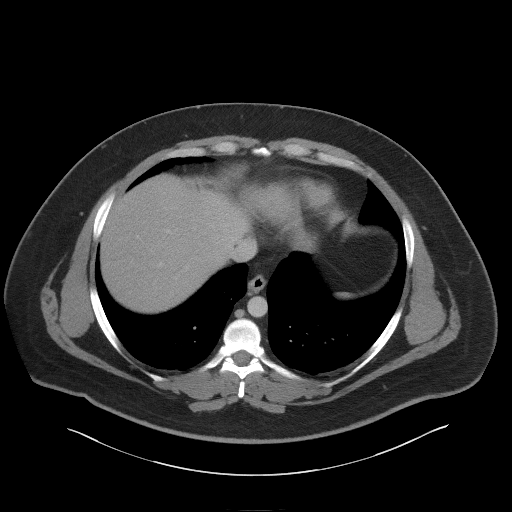
[im 116/122  soft-tissue]
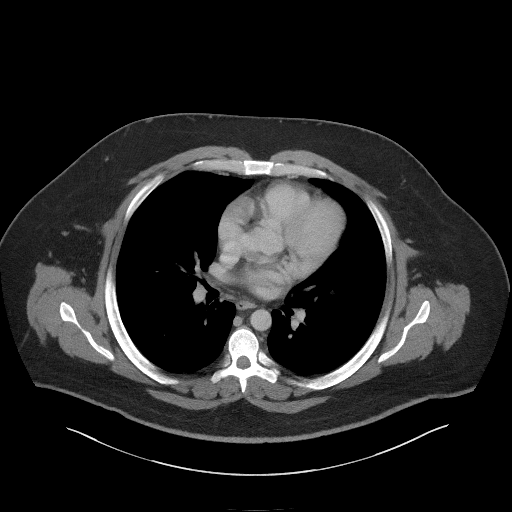

[Series 5: coronal st · coronal · 0.82mm/px · 3 of 114 slices shown]
[im 29/114  soft-tissue]
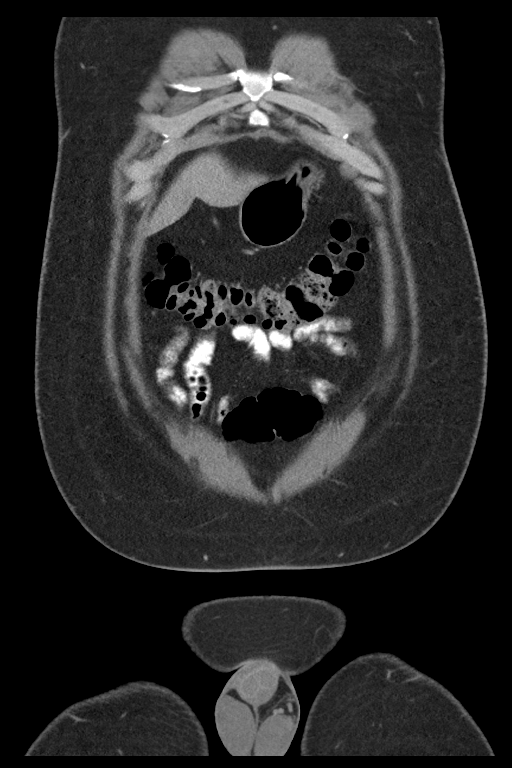
[im 57/114  soft-tissue]
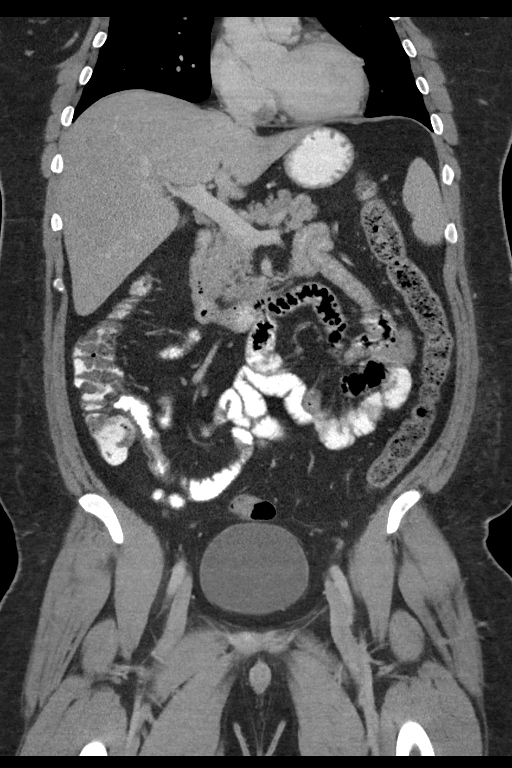
[im 85/114  soft-tissue]
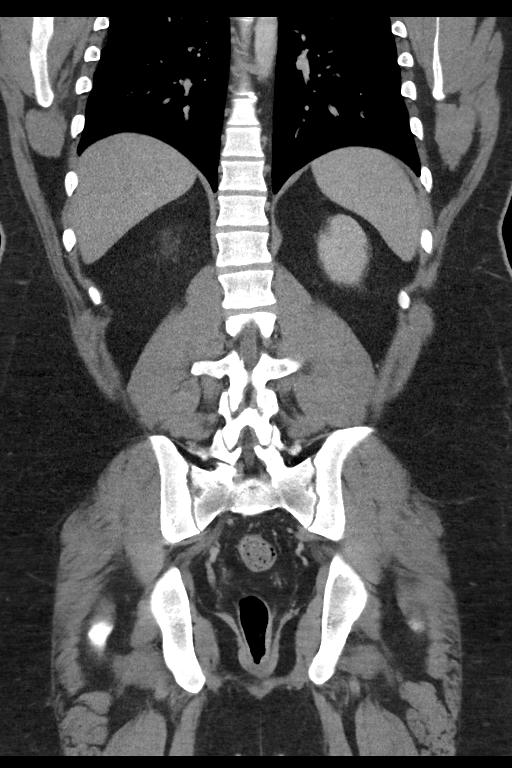

[15 of 46 positions shown; findings below may reference images not displayed]

FINDINGS: Lower chest: The visualized lung bases are grossly clear. The
visualized portions of the mediastinum are unremarkable.

Hepatobiliary: The liver is unremarkable in appearance. The
gallbladder is unremarkable in appearance. The common bile duct
remains normal in caliber.

Pancreas: The pancreas is within normal limits.

Spleen: The spleen is unremarkable in appearance.

Adrenals/Urinary Tract: The adrenal glands are unremarkable in
appearance. The kidneys are within normal limits. There is no
evidence of hydronephrosis. No renal or ureteral stones are
identified. No perinephric stranding is seen.

Stomach/Bowel: The stomach is unremarkable in appearance. The small
bowel is within normal limits. The appendix is normal in caliber,
without evidence of appendicitis. The colon is unremarkable in
appearance.

Vascular/Lymphatic: The abdominal aorta is unremarkable in
appearance. The inferior vena cava is grossly unremarkable. No
retroperitoneal lymphadenopathy is seen. No pelvic sidewall
lymphadenopathy is identified.

Reproductive: The bladder is mildly distended. The prostate remains
normal in size.

Other: The testes are symmetric and unremarkable in appearance,
noted at the scrotal sac. The inguinal canals are grossly
unremarkable.

Musculoskeletal: No acute osseous abnormalities are identified. The
visualized musculature is unremarkable in appearance.
IMPRESSION: Testes symmetric and unremarkable in appearance. Inguinal canals are
unremarkable.

## 2017-09-26 ENCOUNTER — Encounter: Payer: Self-pay | Admitting: *Deleted

## 2017-09-26 ENCOUNTER — Ambulatory Visit
Admission: EM | Admit: 2017-09-26 | Discharge: 2017-09-26 | Disposition: A | Discharge status: Home / Self Care | Payer: Managed Care, Other (non HMO) | Attending: Family Medicine | Admitting: Family Medicine

## 2017-09-26 ENCOUNTER — Other Ambulatory Visit: Payer: Self-pay

## 2017-09-26 DIAGNOSIS — R1032 Left lower quadrant pain: Secondary | ICD-10-CM

## 2017-09-26 DIAGNOSIS — R112 Nausea with vomiting, unspecified: Secondary | ICD-10-CM | POA: Diagnosis not present

## 2017-09-26 DIAGNOSIS — R197 Diarrhea, unspecified: Secondary | ICD-10-CM

## 2017-09-26 LAB — CBC WITH DIFFERENTIAL/PLATELET
BASOS PCT: 0 %
Basophils Absolute: 0 10*3/uL (ref 0–0.1)
EOS ABS: 0.1 10*3/uL (ref 0–0.7)
EOS PCT: 1 %
HCT: 45.4 % (ref 40.0–52.0)
Hemoglobin: 15.8 g/dL (ref 13.0–18.0)
LYMPHS ABS: 1.1 10*3/uL (ref 1.0–3.6)
Lymphocytes Relative: 8 %
MCH: 30.3 pg (ref 26.0–34.0)
MCHC: 34.8 g/dL (ref 32.0–36.0)
MCV: 87.1 fL (ref 80.0–100.0)
Monocytes Absolute: 0.8 10*3/uL (ref 0.2–1.0)
Monocytes Relative: 5 %
Neutro Abs: 12.3 10*3/uL — ABNORMAL HIGH (ref 1.4–6.5)
Neutrophils Relative %: 86 %
PLATELETS: 254 10*3/uL (ref 150–440)
RBC: 5.21 MIL/uL (ref 4.40–5.90)
RDW: 13.7 % (ref 11.5–14.5)
WBC: 14.4 10*3/uL — ABNORMAL HIGH (ref 3.8–10.6)

## 2017-09-26 LAB — BASIC METABOLIC PANEL
Anion gap: 8 (ref 5–15)
BUN: 13 mg/dL (ref 6–20)
CALCIUM: 8.4 mg/dL — AB (ref 8.9–10.3)
CO2: 22 mmol/L (ref 22–32)
CREATININE: 0.77 mg/dL (ref 0.61–1.24)
Chloride: 101 mmol/L (ref 101–111)
GFR calc Af Amer: >60 mL/min (ref >60)
GFR calc non Af Amer: >60 mL/min (ref >60)
Glucose, Bld: 116 mg/dL — ABNORMAL HIGH (ref 65–99)
Potassium: 3.9 mmol/L (ref 3.5–5.1)
SODIUM: 131 mmol/L — AB (ref 135–145)

## 2017-09-26 LAB — URINALYSIS, COMPLETE (UACMP) WITH MICROSCOPIC
Bacteria, UA: NONE SEEN
Bilirubin Urine: NEGATIVE
Glucose, UA: NEGATIVE mg/dL
HGB URINE DIPSTICK: NEGATIVE
Ketones, ur: NEGATIVE mg/dL
Leukocytes, UA: NEGATIVE
Nitrite: NEGATIVE
PH: 7 (ref 5.0–8.0)
Protein, ur: NEGATIVE mg/dL
RBC / HPF: NONE SEEN RBC/hpf (ref 0–5)
SPECIFIC GRAVITY, URINE: 1.02 (ref 1.005–1.030)
Squamous Epithelial / LPF: NONE SEEN

## 2017-09-26 MED ORDER — ONDANSETRON 4 MG PO TBDP: 4.0000 mg | ORAL_TABLET | Freq: Three times a day (TID) | ORAL | 0 refills | Status: DC | PRN

## 2017-09-26 MED ORDER — ONDANSETRON 8 MG PO TBDP
8.0000 mg | ORAL_TABLET | Freq: Once | ORAL | Status: AC
  Administered 2017-09-26: 8 mg via ORAL

## 2017-09-26 NOTE — ED Provider Notes (Addendum)
[facility] ____________________________________________  Time seen: Approximately 1100 AM  I have reviewed the triage vital signs and the nursing notes.   HISTORY  Chief Complaint Abdominal Pain   HPI Cesar Braun is a 27 y.o. male presenting for evaluation of left lower quadrant abdominal pain with accompanying nausea, diarrhea and vomiting.  Patient reports symptoms started yesterday evening.  States he has a girlfriend went out to eat and that it makes him.  States that he had a beef steak, and reports within an hour after eating he started to not feel well.  States he has had 2 episodes of vomiting, multiple episodes of diarrhea.  Stating going to the bathroom once every hour for diarrhea since last night.  Reports accompanying having left lower quadrant cramping abdominal pain, stating currently it is less severe as earlier.  Has not tried any over-the-counter medications for the same complaints.  Denies accompanying cough, congestion, fevers, dysuria, hematuria, hematochezia, melena, blood in stool, or abnormal colored stool.  States his girlfriend tried some of his food and she felt a little sick, but not vomiting and diarrhea.  States abdominal pain does help with applying direct pressure as well as laying on his left side.  Denies other complaints.  Denies trauma. Denies chest pain, shortness of breath,dysuria, extremity pain, extremity swelling or rash. Denies recent sickness. Denies recent antibiotic use.   Of note patient reports he has a history of left lower quadrant abdominal pain approximately 1 year ago.  Patient states that he has family history of diverticulitis and it was been assumed that he may have had diverticulitis, and when she saw GI and had and overall unremarkable colonoscopy with 1 polyp that came back as negative and unremarkable CT scan.  States he does have a history of kidney stone once several years ago without any recurrence.  Patient states that  he has not had continued abdominal pain since last year.   Past Medical History:  Diagnosis Date  . Anxiety   . Hypertension   . Obesity   . Pain    llq  . Sleep apnea     Patient Active Problem List   Diagnosis Date Noted  . Anxiety 10/02/2016  . HTN (hypertension) 10/02/2016  . Obesity 10/02/2016  . Sleep apnea 10/02/2016  . Abdominal pain, LLQ (left lower quadrant) 08/10/2016    Past Surgical History:  Procedure Laterality Date  . KNEE ARTHROSCOPY Right   . TYMPANOSTOMY TUBE PLACEMENT Bilateral    very young     No current facility-administered medications for this encounter.   Current Outpatient Medications:  .  Cholecalciferol (VITAMIN D3) 2000 units capsule, Take by mouth., Disp: , Rfl:  .  cyanocobalamin 500 MCG tablet, Take 1,000 mcg by mouth daily., Disp: , Rfl:  .  lisinopril (PRINIVIL,ZESTRIL) 10 MG tablet, Take 10 mg by mouth daily., Disp: , Rfl:  .  ondansetron (ZOFRAN ODT) 4 MG disintegrating tablet, Take 1 tablet (4 mg total) by mouth every 8 (eight) hours as needed for nausea or vomiting., Disp: 15 tablet, Rfl: 0  Allergies Patient has no known allergies.  Family History  Problem Relation Age of Onset  . Diabetes Mother   . Hypertension Father     Social History Social History   Tobacco Use  . Smoking status: Never Smoker  . Smokeless tobacco: Never Used  Substance Use Topics  . Alcohol use: Yes    Alcohol/week: 4.2 oz    Types: 7 Cans of beer per  week  . Drug use: No    Review of Systems Constitutional: No fever/chills ENT: No sore throat. Cardiovascular: Denies chest pain. Respiratory: Denies shortness of breath. Gastrointestinal: AS above.  Genitourinary: Negative for dysuria. Musculoskeletal: Negative for back pain. Skin: Negative for rash.   ____________________________________________   PHYSICAL EXAM:  VITAL SIGNS: ED Triage Vitals  Enc Vitals Group     BP 09/26/17 1028 (!) 149/97     Pulse Rate 09/26/17 1028 (!)  112     Resp 09/26/17 1028 18     Temp 09/26/17 1028 98.7 F (37.1 C)     Temp Source 09/26/17 1028 Oral     SpO2 09/26/17 1028 100 %     Weight 09/26/17 1029 (!) 310 lb (140.6 kg)     Height 09/26/17 1029 5\' 7"  (1.702 m)     Head Circumference --      Peak Flow --      Pain Score 09/26/17 1029 6     Pain Loc --      Pain Edu? --      Excl. in GC? --     Constitutional: Alert and oriented. Well appearing and in no acute distress. ENT      Head: Normocephalic and atraumatic.      Mouth/Throat: Mucous membranes are moist.Oropharynx non-erythematous. Cardiovascular: Normal rate, regular rhythm. Grossly normal heart sounds.  Good peripheral circulation. Respiratory: Normal respiratory effort without tachypnea nor retractions. Breath sounds are clear and equal bilaterally. No wheezes, rales, rhonchi. Gastrointestinal: Normal Bowel sounds.  Obese abdomen.  Mild left lower quadrant abdominal tenderness to palpation, abdomen otherwise soft and nontender.  Non-guarding.  No CVA tenderness. Musculoskeletal:Steady gait. No midline cervical, thoracic or lumbar tenderness to palpation.   Neurologic:  Normal speech and language. No gross focal neurologic deficits are appreciated. Speech is normal. No gait instability.  Skin:  Skin is warm, dry and intact. No rash noted. Psychiatric: Mood and affect are normal. Speech and behavior are normal. Patient exhibits appropriate insight and judgment   ___________________________________________   LABS (all labs ordered are listed, but only abnormal results are displayed)  Labs Reviewed  CBC WITH DIFFERENTIAL/PLATELET - Abnormal; Notable for the following components:      Result Value   WBC 14.4 (*)    Neutro Abs 12.3 (*)    All other components within normal limits  BASIC METABOLIC PANEL - Abnormal; Notable for the following components:   Sodium 131 (*)    Glucose, Bld 116 (*)    Calcium 8.4 (*)    All other components within normal limits    URINALYSIS, COMPLETE (UACMP) WITH MICROSCOPIC   ____________________________________________   PROCEDURES Procedures    INITIAL IMPRESSION / ASSESSMENT AND PLAN / ED COURSE  Pertinent labs & imaging results that were available during my care of the patient were reviewed by me and considered in my medical decision making (see chart for details).  Overall well-appearing patient.  No acute distress.  Discussed multiple differentials with patient including diverticulitis, nephrolithiasis, history of food poisoning or viral illness.  Urinalysis reviewed and unremarkable, no hematuria.  Discussed lab results in detail with patient.  Reviewed in the last year or 2 other CBCs and leukocytosis present, patient states that this may always be the case for him, in which question chronic leukocytosis.  Labs otherwise unremarkable.  After 8 mg ODT Zofran once in urgent care, patient reports pain has fully resolved and he is feeling better.  Patient continue to monitor and  abdominal pain did not return, suspect viral versus food cause.  Discussed with patient evaluation of GI panel, patient declined.  Encourage rest, fluids, supportive care, PRN Zofran.  Discussed strict follow-up and return parameters,   Including proceeding to the emergency room for worsening complaints.  Patient stated that due to symptoms he had a call out of work and requests work note for today and tomorrow.  Note given.Discussed indication, risks and benefits of medications with patient.  Discussed follow up with Primary care physician this week. Discussed follow up and return parameters including no resolution or any worsening concerns. Patient verbalized understanding and agreed to plan.   ____________________________________________   FINAL CLINICAL IMPRESSION(S) / ED DIAGNOSES  Final diagnoses:  Nausea vomiting and diarrhea  Left lower quadrant pain     ED Discharge Orders        Ordered    ondansetron (ZOFRAN ODT) 4 MG  disintegrating tablet  Every 8 hours PRN     09/26/17 1139       Note: This dictation was prepared with Dragon dictation along with smaller phrase technology. Any transcriptional errors that result from this process are unintentional.           Renford Dills, NP 09/26/17 1334

## 2017-09-26 NOTE — Discharge Instructions (Signed)
Take medication as prescribed. Rest. Drink plenty of fluids.  ° °Follow up with your primary care physician this week as needed. Return to Urgent care or Emergency room for new or worsening concerns.  ° °

## 2017-09-26 NOTE — ED Triage Notes (Addendum)
Patient started having LLQ abdominal pain yesterday afternoon. Additional symptoms of nausea vomiting and diarrhea are also present. No previous history of GI problems. Patient thinks it may be from some [place] food he ate yesterday.

## 2017-09-26 NOTE — ED Notes (Signed)
Bed: MUC05 Expected date: 09/26/17 Expected time:  Means of arrival:  Comments: Cardiac/ treatment

## 2017-10-12 ENCOUNTER — Ambulatory Visit: Payer: Self-pay | Admitting: Family Medicine

## 2017-10-12 VITALS — BP 138/82 | HR 96 | Temp 98.2°F | Resp 20

## 2017-10-12 DIAGNOSIS — J329 Chronic sinusitis, unspecified: Secondary | ICD-10-CM

## 2017-10-12 MED ORDER — AMOXICILLIN-POT CLAVULANATE 875-125 MG PO TABS: 1.0000 | ORAL_TABLET | Freq: Two times a day (BID) | ORAL | 0 refills | Status: AC

## 2017-10-12 NOTE — Progress Notes (Signed)
Subjective: Congestion     Cesar Braun is a 27 y.o. male who presents for evaluation of nasal congestion with purulent discharge, nonproductive cough, facial pressure, fatigue, chills, headache, and itchy/watery eyes for 2 days.  Denies severe symptoms but is concerned they might be worsening. Treatment to date: Over-the-counter decongestants and began using Flonase 2 days ago.  Denies rash, nausea, vomiting, diarrhea, shortness of breath, wheezing, chest or back pain, ear pain, sore throat, difficulty swallowing, confusion, body aches, or fever. History of asthma, COPD: Negative History of recurrent sinus and/or lung infections: Sinus infections.  Reports getting 4 a year.  Saw ENT as a child but not since then.  History of allergic rhinitis, takes OTC antihistamine as needed.  Reports this feels like a sinus infection. Antibiotic use in the last 3 months: Negative.   Review of Systems Pertinent items noted in HPI and remainder of comprehensive ROS otherwise negative.     Objective:   Physical Exam General: Awake, alert, and oriented. No acute distress. Well developed, hydrated and nourished. Appears stated age. Nontoxic appearance.  Afebrile HEENT:  PND noted.  Mild erythema to posterior oropharynx.  No edema or exudates of pharynx or tonsils. No erythema or bulging of TM.  Mild erythema/edema to nasal mucosa.  Bilateral maxillary and frontal sinus tenderness.  Supple neck without adenopathy. Cardiac: Heart rate and rhythm are normal. No murmurs, gallops, or rubs are auscultated. S1 and S2 are heard and are of normal intensity.  Respiratory: No signs of respiratory distress. Lungs clear. No tachypnea. Able to speak in full sentences without dyspnea. Nonlabored respirations.  Skin: Skin is warm, dry and intact. Appropriate color for ethnicity. No cyanosis noted.   Diagnostic Results: None.  Assessment:    sinusitis and viral upper respiratory illness   Plan:    Discussed diagnosis  and treatment of URI. Discussed the diagnosis and treatment of sinusitis. Discussed the importance of avoiding unnecessary antibiotic therapy. Suggested symptomatic OTC remedies. Nasal saline spray for congestion.   Delayed antibiotic prescription for Augmentin provided with explicit instructions only to fill if severe symptoms develop, he develops "double sickening", or no improvement in symptoms within 10-14 days of illness.  Patient verbally agreed.  Side/adverse effects of antibiotics discussed. Recommended referral to ENT due to frequent sinus infections, patient declined. Follow-up with primary care provider. Discussed red flag symptoms and circumstances with which to seek medical care.

## 2017-10-20 ENCOUNTER — Encounter: Payer: Self-pay | Admitting: Emergency Medicine

## 2017-10-20 ENCOUNTER — Emergency Department
Admission: EM | Admit: 2017-10-20 | Discharge: 2017-10-20 | Disposition: A | Discharge status: Home / Self Care | Payer: No Typology Code available for payment source | Attending: Emergency Medicine | Admitting: Emergency Medicine

## 2017-10-20 ENCOUNTER — Other Ambulatory Visit: Payer: Self-pay

## 2017-10-20 DIAGNOSIS — Y998 Other external cause status: Secondary | ICD-10-CM | POA: Diagnosis not present

## 2017-10-20 DIAGNOSIS — S81852A Open bite, left lower leg, initial encounter: Secondary | ICD-10-CM | POA: Insufficient documentation

## 2017-10-20 DIAGNOSIS — I10 Essential (primary) hypertension: Secondary | ICD-10-CM | POA: Diagnosis not present

## 2017-10-20 DIAGNOSIS — Y9389 Activity, other specified: Secondary | ICD-10-CM | POA: Diagnosis not present

## 2017-10-20 DIAGNOSIS — W540XXA Bitten by dog, initial encounter: Secondary | ICD-10-CM | POA: Insufficient documentation

## 2017-10-20 DIAGNOSIS — S61452A Open bite of left hand, initial encounter: Secondary | ICD-10-CM | POA: Insufficient documentation

## 2017-10-20 DIAGNOSIS — Y929 Unspecified place or not applicable: Secondary | ICD-10-CM | POA: Diagnosis not present

## 2017-10-20 DIAGNOSIS — Z23 Encounter for immunization: Secondary | ICD-10-CM | POA: Diagnosis not present

## 2017-10-20 DIAGNOSIS — S6992XA Unspecified injury of left wrist, hand and finger(s), initial encounter: Secondary | ICD-10-CM | POA: Diagnosis present

## 2017-10-20 DIAGNOSIS — Z79899 Other long term (current) drug therapy: Secondary | ICD-10-CM | POA: Insufficient documentation

## 2017-10-20 MED ORDER — TETANUS-DIPHTH-ACELL PERTUSSIS 5-2.5-18.5 LF-MCG/0.5 IM SUSP
INTRAMUSCULAR | Status: AC
  Filled 2017-10-20: qty 0.5

## 2017-10-20 MED ORDER — AMOXICILLIN-POT CLAVULANATE 875-125 MG PO TABS
1.0000 | ORAL_TABLET | Freq: Once | ORAL | Status: AC
Start: 2017-10-20 — End: 2017-10-20
  Administered 2017-10-20: 1 via ORAL
  Filled 2017-10-20: qty 1

## 2017-10-20 MED ORDER — TRAMADOL HCL 50 MG PO TABS: 50.0000 mg | ORAL_TABLET | Freq: Two times a day (BID) | ORAL | 0 refills | Status: DC | PRN

## 2017-10-20 MED ORDER — NAPROXEN 500 MG PO TABS
500.0000 mg | ORAL_TABLET | Freq: Once | ORAL | Status: AC
Start: 2017-10-20 — End: 2017-10-20
  Administered 2017-10-20: 500 mg via ORAL
  Filled 2017-10-20: qty 1

## 2017-10-20 MED ORDER — AMOXICILLIN-POT CLAVULANATE 875-125 MG PO TABS: 1.0000 | ORAL_TABLET | Freq: Two times a day (BID) | ORAL | 0 refills | Status: AC

## 2017-10-20 MED ORDER — NAPROXEN 500 MG PO TABS: 500.0000 mg | ORAL_TABLET | Freq: Two times a day (BID) | ORAL | Status: DC

## 2017-10-20 MED ORDER — TETANUS-DIPHTH-ACELL PERTUSSIS 5-2.5-18.5 LF-MCG/0.5 IM SUSP
0.5000 mL | Freq: Once | INTRAMUSCULAR | Status: AC
  Administered 2017-10-20: 0.5 mL via INTRAMUSCULAR

## 2017-10-20 MED ORDER — TETANUS-DIPHTHERIA TOXOIDS TD 5-2 LFU IM INJ: 0.5000 mL | INJECTION | Freq: Once | INTRAMUSCULAR | Status: DC

## 2017-10-20 NOTE — ED Provider Notes (Signed)
[facility] Emergency Department Provider Note   ____________________________________________   First MD Initiated Contact with Patient 10/20/17 [phone]     (approximate)  I have reviewed the triage vital signs and the nursing notes.   HISTORY  Chief Complaint Animal Bite    HPI Cesar Braun is a 27 y.o. male patient presents with dog bite to the left hand and left thigh which occurred approximately 2 and half hours ago.  Animal enhancement identified in in the custody of animal control.  Patient unsure of last tetanus shot.  Patient rates pain as a 5/10.  Patient described the pain is "achy".  Patient denies loss of sensation or loss of function of the affected extremities.  Past Medical History:  Diagnosis Date  . Anxiety   . Hypertension   . Obesity   . Pain    llq  . Sleep apnea     Patient Active Problem List   Diagnosis Date Noted  . Anxiety 10/02/2016  . HTN (hypertension) 10/02/2016  . Obesity 10/02/2016  . Sleep apnea 10/02/2016  . Abdominal pain, LLQ (left lower quadrant) 08/10/2016    Past Surgical History:  Procedure Laterality Date  . KNEE ARTHROSCOPY Right   . TYMPANOSTOMY TUBE PLACEMENT Bilateral    very young    Prior to Admission medications   Medication Sig Start Date End Date Taking? Authorizing Provider  amoxicillin-clavulanate (AUGMENTIN) 875-125 MG tablet Take 1 tablet by mouth 2 (two) times daily for 10 days. 10/12/17 10/22/17  D'Jernes, Jasmine PangKatherine M, FNP  amoxicillin-clavulanate (AUGMENTIN) 875-125 MG tablet Take 1 tablet by mouth 2 (two) times daily for 10 days. 10/20/17 10/30/17  Joni ReiningSmith, Lexus Shampine K, PA-C  Cholecalciferol (VITAMIN D3) 2000 units capsule Take by mouth.    [provider]  cyanocobalamin 500 MCG tablet Take 1,000 mcg by mouth daily.    [provider]  lisinopril (PRINIVIL,ZESTRIL) 10 MG tablet Take 10 mg by mouth daily.    [provider]  naproxen (NAPROSYN) 500 MG tablet Take 1  tablet (500 mg total) by mouth 2 (two) times daily with a meal. 10/20/17   Joni ReiningSmith, Aeson Sawyers K, PA-C  ondansetron (ZOFRAN ODT) 4 MG disintegrating tablet Take 1 tablet (4 mg total) by mouth every 8 (eight) hours as needed for nausea or vomiting. 09/26/17   Renford DillsMiller, Lindsey, NP  traMADol (ULTRAM) 50 MG tablet Take 1 tablet (50 mg total) by mouth every 12 (twelve) hours as needed. 10/20/17   Joni ReiningSmith, Ninah Moccio K, PA-C    Allergies Patient has no known allergies.  Family History  Problem Relation Age of Onset  . Diabetes Mother   . Hypertension Father     Social History Social History   Tobacco Use  . Smoking status: Never Smoker  . Smokeless tobacco: Never Used  Substance Use Topics  . Alcohol use: Yes    Alcohol/week: 4.2 oz    Types: 7 Cans of beer per week  . Drug use: No    Review of Systems  Constitutional: No fever/chills Eyes: No visual changes. ENT: No sore throat. Cardiovascular: Denies chest pain. Respiratory: Denies shortness of breath. Gastrointestinal: No abdominal pain.  No nausea, no vomiting.  No diarrhea.  No constipation. Genitourinary: Negative for dysuria. Musculoskeletal: Negative for back pain. Skin: Negative for rash. Neurological: Negative for headaches, focal weakness or numbness. Psychiatric:Anxiety Endocrine:Hypertension ____________________________________________   PHYSICAL EXAM:  VITAL SIGNS: ED Triage Vitals  Enc Vitals Group     BP 10/20/17 0821 (!) 151/104  Pulse Rate 10/20/17 0821 (!) 105     Resp 10/20/17 0821 15     Temp 10/20/17 0821 98.3 F (36.8 C)     Temp Source 10/20/17 0821 Oral     SpO2 10/20/17 0821 100 %     Weight 10/20/17 0823 (!) 310 lb (140.6 kg)     Height 10/20/17 0823 5\' 7"  (1.702 m)     Head Circumference --      Peak Flow --      Pain Score 10/20/17 0825 4     Pain Loc --      Pain Edu? --      Excl. in GC? --     Constitutional: Alert and oriented. Well appearing and in no acute distress. Cardiovascular:  Normal rate, regular rhythm. Grossly normal heart sounds.  Good peripheral circulation.  Elevated blood pressure Respiratory: Normal respiratory effort.  No retractions. Lungs CTAB. Musculoskeletal: No lower extremity tenderness nor edema.  No joint effusions. Neurologic:  Normal speech and language. No gross focal neurologic deficits are appreciated. No gait instability. Skin: Puncture wound right hand.  Superficial abrasion left anterior thigh.  Psychiatric: Mood and affect are normal. Speech and behavior are normal.  ____________________________________________   LABS (all labs ordered are listed, but only abnormal results are displayed)  Labs Reviewed - No data to display ____________________________________________  EKG   ____________________________________________  RADIOLOGY  ED MD interpretation:    Official radiology report(s): No results found.  ____________________________________________   PROCEDURES  Procedure(s) performed: None  Procedures  Critical Care performed: No  ____________________________________________   INITIAL IMPRESSION / ASSESSMENT AND PLAN / ED COURSE  As part of my medical decision making, I reviewed the following data within the electronic MEDICAL RECORD NUMBER    Patient was a dog bite to the left hand and left thigh.  Discussed with patient rationale for not suturing wounds at this time.  Patient is also thoroughly cleaned and re-bandaged.  Patient given a tetanus shot.  Patient started on Augmentin and naproxen.  Patient given discharge care instructions.  Advised to contact Animal control for Rabies status of dog.  ____________________________________________   FINAL CLINICAL IMPRESSION(S) / ED DIAGNOSES  Final diagnoses:  Dog bite, initial encounter     ED Discharge Orders        Ordered    amoxicillin-clavulanate (AUGMENTIN) 875-125 MG tablet  2 times daily     10/20/17 0908    naproxen (NAPROSYN) 500 MG tablet  2 times  daily with meals     10/20/17 0908    traMADol (ULTRAM) 50 MG tablet  Every 12 hours PRN     10/20/17 0908       Note:  This document was prepared using Dragon voice recognition software and may include unintentional dictation errors.    Joni Reining, PA-C 10/20/17 0911    Nita Sickle, MD 10/20/17 [phone]

## 2017-10-20 NOTE — ED Notes (Signed)
Patient advises he found talked to the dogs vet and the dog has been vaccinated for rabies and is current. States dog had no unusual behavior.

## 2017-10-20 NOTE — Discharge Instructions (Addendum)
Advised to contact animal control for rabies status of dog.  His rabies shots and need to return back to this department.

## 2017-10-20 NOTE — ED Notes (Signed)
Pt to ed with c/o dog bite to left hand palm and left upper thigh.  Bleeding controlled in both places. Puncture wounds noted to palm and left upper thigh.

## 2017-10-20 NOTE — ED Triage Notes (Signed)
Dog bite L hand and thigh 2 hours ago. Identified dog. Attempting to find information on dog. Animal control has dog.

## 2018-11-16 ENCOUNTER — Emergency Department
Admission: EM | Admit: 2018-11-16 | Discharge: 2018-11-16 | Disposition: A | Discharge status: Home / Self Care | Payer: BC Managed Care – PPO | Attending: Emergency Medicine | Admitting: Emergency Medicine

## 2018-11-16 ENCOUNTER — Other Ambulatory Visit: Payer: Self-pay

## 2018-11-16 ENCOUNTER — Emergency Department: Payer: BC Managed Care – PPO

## 2018-11-16 ENCOUNTER — Encounter: Payer: Self-pay | Admitting: Emergency Medicine

## 2018-11-16 DIAGNOSIS — N1339 Other hydronephrosis: Secondary | ICD-10-CM | POA: Insufficient documentation

## 2018-11-16 DIAGNOSIS — R1032 Left lower quadrant pain: Secondary | ICD-10-CM | POA: Diagnosis present

## 2018-11-16 DIAGNOSIS — Z79899 Other long term (current) drug therapy: Secondary | ICD-10-CM | POA: Diagnosis not present

## 2018-11-16 DIAGNOSIS — I10 Essential (primary) hypertension: Secondary | ICD-10-CM | POA: Diagnosis not present

## 2018-11-16 DIAGNOSIS — N200 Calculus of kidney: Secondary | ICD-10-CM

## 2018-11-16 LAB — COMPREHENSIVE METABOLIC PANEL
ALT: 49 U/L — ABNORMAL HIGH (ref 0–44)
AST: 32 U/L (ref 15–41)
Albumin: 4.1 g/dL (ref 3.5–5.0)
Alkaline Phosphatase: 106 U/L (ref 38–126)
Anion gap: 11 (ref 5–15)
BUN: 10 mg/dL (ref 6–20)
CO2: 27 mmol/L (ref 22–32)
Calcium: 8.9 mg/dL (ref 8.9–10.3)
Chloride: 102 mmol/L (ref 98–111)
Creatinine, Ser: 0.75 mg/dL (ref 0.61–1.24)
GFR calc Af Amer: >60 mL/min (ref >60)
GFR calc non Af Amer: >60 mL/min (ref >60)
Glucose, Bld: 108 mg/dL — ABNORMAL HIGH (ref 70–99)
Potassium: 4.2 mmol/L (ref 3.5–5.1)
Sodium: 140 mmol/L (ref 135–145)
Total Bilirubin: 1.6 mg/dL — ABNORMAL HIGH (ref 0.3–1.2)
Total Protein: 7.2 g/dL (ref 6.5–8.1)

## 2018-11-16 LAB — URINALYSIS, COMPLETE (UACMP) WITH MICROSCOPIC
Bilirubin Urine: NEGATIVE
Glucose, UA: NEGATIVE mg/dL
Ketones, ur: NEGATIVE mg/dL
Leukocytes,Ua: NEGATIVE
Nitrite: NEGATIVE
Protein, ur: NEGATIVE mg/dL
Specific Gravity, Urine: 1.016 (ref 1.005–1.030)
pH: 6 (ref 5.0–8.0)

## 2018-11-16 LAB — CBC
HCT: 45.7 % (ref 39.0–52.0)
Hemoglobin: 15.3 g/dL (ref 13.0–17.0)
MCH: 30.3 pg (ref 26.0–34.0)
MCHC: 33.5 g/dL (ref 30.0–36.0)
MCV: 90.5 fL (ref 80.0–100.0)
Platelets: 264 10*3/uL (ref 150–400)
RBC: 5.05 MIL/uL (ref 4.22–5.81)
RDW: 13.1 % (ref 11.5–15.5)
WBC: 9.7 10*3/uL (ref 4.0–10.5)
nRBC: 0 % (ref 0.0–0.2)

## 2018-11-16 MED ORDER — TAMSULOSIN HCL 0.4 MG PO CAPS: 0.4000 mg | ORAL_CAPSULE | Freq: Every day | ORAL | 0 refills | Status: AC

## 2018-11-16 MED ORDER — ONDANSETRON HCL 4 MG PO TABS: 4.0000 mg | ORAL_TABLET | Freq: Three times a day (TID) | ORAL | 0 refills | Status: AC | PRN

## 2018-11-16 NOTE — ED Provider Notes (Signed)
[facility] Emergency Department Provider Note  ____________________________________________   I have reviewed the triage vital signs and the nursing notes. Where available I have reviewed prior notes and, if possible and indicated, outside hospital notes.    HISTORY  Chief Complaint Flank Pain    HPI Cesar Braun is a 28 y.o. male  With a history of anxiety, obesity, left-sided abdominal pain who had a work-up in 2018 for left-sided pain going to the left testicle which was negative on this ultrasound and negative the CT at that time, states that he is having intermittent pain on the left flank this time.  Does radiate towards the groin but does not seem reproducibly involved in the groin.  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  He denies any nausea vomiting fever chills or abdominal pain.  He has had occasional episodes of urinary hesitancy to go along with the pain.  The first episode lasted for about half hour 3 weeks ago, went away, and then on 16 April he had another episode on the left side and he went to a urgent care they told him he had a kidney stone, they checked a urinalysis which did not show an infection they gave him some muscle relaxers and something to help him pass the presumed stone, and sent him home.  Patient states he has been doing well until today and then today again the pain came back in the morning.  Woke him up left-sided pain, radiating down.  Flank pain lower back pain.  No numbness no weakness no incontinence of bowel or bladder no recent trauma or fall no significant testicular pain no dysuria or urinary frequency but he does have having degree of hesitancy when these events happen.  This time he is pain-free, as it has before the pain is completely gone away.  He does not have any radicular pain nothing goes down his leg.  He has no chest pain or shortness of breath, does not seem to be influenced by  exertion.  This time it happened while he was asleep.  Other prior infestations, pain can be 7 or 8 when it is bad, at this time is 0.  Lasts for brief periods of time as described above.  No other radiation. He sexually active with one person has no penile discharge or suspicion in his mind that he has an STI   Past Medical History:  Diagnosis Date  . Anxiety   . Hypertension   . Obesity   . Pain    llq  . Sleep apnea     Patient Active Problem List   Diagnosis Date Noted  . Anxiety 10/02/2016  . HTN (hypertension) 10/02/2016  . Obesity 10/02/2016  . Sleep apnea 10/02/2016  . Abdominal pain, LLQ (left lower quadrant) 08/10/2016    Past Surgical History:  Procedure Laterality Date  . KNEE ARTHROSCOPY Right   . TYMPANOSTOMY TUBE PLACEMENT Bilateral    very young    Prior to Admission medications   Medication Sig Start Date End Date Taking? Authorizing Provider  Cholecalciferol (VITAMIN D3) 2000 units capsule Take by mouth.    [provider]  cyanocobalamin 500 MCG tablet Take 1,000 mcg by mouth daily.    [provider]  lisinopril (PRINIVIL,ZESTRIL) 10 MG tablet Take 10 mg by mouth daily.    [provider]  naproxen (NAPROSYN) 500 MG tablet Take 1 tablet (500 mg total) by mouth 2 (  two) times daily with a meal. 10/20/17   Joni Reining, PA-C  ondansetron (ZOFRAN ODT) 4 MG disintegrating tablet Take 1 tablet (4 mg total) by mouth every 8 (eight) hours as needed for nausea or vomiting. 09/26/17   Renford Dills, NP  traMADol (ULTRAM) 50 MG tablet Take 1 tablet (50 mg total) by mouth every 12 (twelve) hours as needed. 10/20/17   Joni Reining, PA-C    Allergies Patient has no known allergies.  Family History  Problem Relation Age of Onset  . Diabetes Mother   . Hypertension Father     Social History Social History   Tobacco Use  . Smoking status: Never Smoker  . Smokeless tobacco: Never Used  Substance Use Topics  . Alcohol use: Yes     Alcohol/week: 7.0 standard drinks    Types: 7 Cans of beer per week  . Drug use: No    Review of Systems Constitutional: No fever/chills Eyes: No visual changes. ENT: No sore throat. No stiff neck no neck pain Cardiovascular: Denies chest pain. Respiratory: Denies shortness of breath. Gastrointestinal:   no vomiting.  No diarrhea.  No constipation. Genitourinary: Negative for dysuria. Musculoskeletal: Negative lower extremity swelling Skin: Negative for rash. Neurological: Negative for severe headaches, focal weakness or numbness.   ____________________________________________   PHYSICAL EXAM:  VITAL SIGNS: ED Triage Vitals  Enc Vitals Group     BP 11/16/18 1303 (!) 152/105     Pulse Rate 11/16/18 1303 96     Resp 11/16/18 1303 20     Temp 11/16/18 1303 97.9 F (36.6 C)     Temp Source 11/16/18 1303 Oral     SpO2 11/16/18 1303 100 %     Weight 11/16/18 1304 (!) 320 lb (145.2 kg)     Height 11/16/18 1304  (1.702 m)     Head Circumference --      Peak Flow --      Pain Score 11/16/18 1304 4     Pain Loc --      Pain Edu? --      Excl. in GC? --     Constitutional: Alert and oriented. Well appearing and in no acute distress. Eyes: Conjunctivae are normal Head: Atraumatic HEENT: No congestion/rhinnorhea. Mucous membranes are moist.  Oropharynx non-erythematous Neck:   Nontender with no meningismus, no masses, no stridor Cardiovascular: Normal rate, regular rhythm. Grossly normal heart sounds.  Good peripheral circulation. Respiratory: Normal respiratory effort.  No retractions. Lungs CTAB. Abdominal: Soft and nontender. No distention. No guarding no rebound obesity noted Back:  There is no focal tenderness or step off.  there is no midline tenderness there are no lesions noted. there is mild left CVA tenderness : Normal external genitalia, not significantly tender, normal in appearance, no discharge. Musculoskeletal: No lower extremity tenderness, no upper  extremity tenderness. No joint effusions, no DVT signs strong distal pulses no edema Neurologic:  Normal speech and language. No gross focal neurologic deficits are appreciated.  Skin:  Skin is warm, dry and intact. No rash noted. Psychiatric: Mood and affect are normal. Speech and behavior are normal.  ____________________________________________   LABS (all labs ordered are listed, but only abnormal results are displayed)  Labs Reviewed  COMPREHENSIVE METABOLIC PANEL - Abnormal; Notable for the following components:      Result Value   Glucose, Bld 108 (*)    ALT 49 (*)    Total Bilirubin 1.6 (*)    All other components within normal  limits  URINE CULTURE  CBC  URINALYSIS, COMPLETE (UACMP) WITH MICROSCOPIC    Pertinent labs  results that were available during my care of the patient were reviewed by me and considered in my medical decision making (see chart for details). ____________________________________________  EKG  I personally interpreted any EKGs ordered by me or triage  ____________________________________________  RADIOLOGY  Pertinent labs & imaging results that were available during my care of the patient were reviewed by me and considered in my medical decision making (see chart for details). If possible, patient and/or family made aware of any abnormal findings.  No results found. ____________________________________________    PROCEDURES  Procedure(s) performed: None  Procedures  Critical Care performed: None  ____________________________________________   INITIAL IMPRESSION / ASSESSMENT AND PLAN / ED COURSE  Pertinent labs & imaging results that were available during my care of the patient were reviewed by me and considered in my medical decision making (see chart for details).  Patient with left-sided flank pain intermittently for several weeks, has been diagnosed without imaging with a CT scan the CT scan we did here did not show any evidence  of acute stone but it could have passed.  Patient's notes going back to January 2018 show that he has had left-sided lower abdominal discomfort off and on since then he saw a GI doctor for it.  This is therefore acute on chronic recurrent discomfort, off and on for 2-1/2 years despite negative CT scan negative ultrasound.  He also had a colonoscopy done in April 2018, which showed no acute abnormality, I personally reviewed these findings.  There was a tiny polyp-like bump.  Internal hemorrhoids.  In addition, patient had an ultrasound of his testicles at that time which showed no mass or other pathology.  Given his recurrent flank pain, and the fact that he is morbidly obese, we will obtain CT scan is my exam somewhat limited.  Diverticulitis is certainly a possibility but was not seen on colonoscopy or prior CT.  In addition, kidney stone, pyelonephritis, musculoskeletal pain is all considered.  Low suspicion for ACS PE or dissection, this is reproducible lower lateral discomfort with radiation towards the lower part of the abdomen.  There is no evidence of cauda equina syndrome, and he is pain-free at this time with normal neurologic exam.   ____________________________________________   FINAL CLINICAL IMPRESSION(S) / ED DIAGNOSES  Final diagnoses:  None      This chart was dictated using voice recognition software.  Despite best efforts to proofread,  errors can occur which can change meaning.      Jeanmarie Plant, MD 11/16/18 [phone]

## 2018-11-16 NOTE — ED Triage Notes (Signed)
Intermittent flank pain x 3 weeks.

## 2018-11-16 NOTE — ED Notes (Signed)
See triage note. Pt states he has had intermittent CL flank pain x 2 weeks. Pain described as "squeezing" or a "spasm" like pain deep in the L lower flank/back which radiates to L groin. Pain accompanied with increased urinary urgency but decreased urinary flow until pain subsides. Pt feels that it may be a kidney stone but denies Hx of similar Sx. No Hx of STI or UTI. Pt A&Ox4, NAD.

## 2018-11-16 NOTE — Discharge Instructions (Signed)
To the emergency room for any new or worrisome symptoms including especially fever, or pain this not well controlled.  Muscle relaxers will not work well for this pain but ibuprofen, as directed with food will be the best medication.  Continue taking the Flomax as well.  Stone is very small, 1.5 mm, and is at the UVJ which is close to the bladder and should pass on its own soon, however, we are giving the number for a urologist to follow-up with closely.

## 2018-11-17 LAB — URINE CULTURE: Culture: NO GROWTH

## 2020-08-13 ENCOUNTER — Encounter: Payer: Self-pay | Admitting: Emergency Medicine

## 2020-08-13 ENCOUNTER — Emergency Department
Admission: EM | Admit: 2020-08-13 | Discharge: 2020-08-14 | Disposition: A | Discharge status: Home / Self Care | Payer: Managed Care, Other (non HMO) | Attending: Emergency Medicine | Admitting: Emergency Medicine

## 2020-08-13 ENCOUNTER — Other Ambulatory Visit: Payer: Self-pay

## 2020-08-13 ENCOUNTER — Emergency Department: Payer: Managed Care, Other (non HMO)

## 2020-08-13 DIAGNOSIS — I10 Essential (primary) hypertension: Secondary | ICD-10-CM | POA: Insufficient documentation

## 2020-08-13 DIAGNOSIS — J40 Bronchitis, not specified as acute or chronic: Secondary | ICD-10-CM | POA: Diagnosis not present

## 2020-08-13 DIAGNOSIS — R0602 Shortness of breath: Secondary | ICD-10-CM | POA: Diagnosis present

## 2020-08-13 DIAGNOSIS — U099 Post covid-19 condition, unspecified: Secondary | ICD-10-CM | POA: Diagnosis not present

## 2020-08-13 LAB — CBC WITH DIFFERENTIAL/PLATELET
Abs Immature Granulocytes: 0.02 10*3/uL (ref 0.00–0.07)
Basophils Absolute: 0 10*3/uL (ref 0.0–0.1)
Basophils Relative: 0 %
Eosinophils Absolute: 0 10*3/uL (ref 0.0–0.5)
Eosinophils Relative: 0 %
HCT: 46.1 % (ref 39.0–52.0)
Hemoglobin: 15.6 g/dL (ref 13.0–17.0)
Immature Granulocytes: 0 %
Lymphocytes Relative: 19 %
Lymphs Abs: 1.5 10*3/uL (ref 0.7–4.0)
MCH: 29.9 pg (ref 26.0–34.0)
MCHC: 33.8 g/dL (ref 30.0–36.0)
MCV: 88.3 fL (ref 80.0–100.0)
Monocytes Absolute: 0.7 10*3/uL (ref 0.1–1.0)
Monocytes Relative: 9 %
Neutro Abs: 5.4 10*3/uL (ref 1.7–7.7)
Neutrophils Relative %: 72 %
Platelets: 175 10*3/uL (ref 150–400)
RBC: 5.22 MIL/uL (ref 4.22–5.81)
RDW: 13.4 % (ref 11.5–15.5)
WBC: 7.5 10*3/uL (ref 4.0–10.5)
nRBC: 0 % (ref 0.0–0.2)

## 2020-08-13 MED ORDER — METHYLPREDNISOLONE SODIUM SUCC 125 MG IJ SOLR
125.0000 mg | Freq: Once | INTRAMUSCULAR | Status: AC
  Administered 2020-08-13: 125 mg via INTRAVENOUS
  Filled 2020-08-13: qty 2

## 2020-08-13 MED ORDER — ONDANSETRON HCL 4 MG/2ML IJ SOLN
4.0000 mg | Freq: Once | INTRAMUSCULAR | Status: AC
  Administered 2020-08-13: 4 mg via INTRAVENOUS
  Filled 2020-08-13: qty 2

## 2020-08-13 MED ORDER — LACTATED RINGERS IV BOLUS
1000.0000 mL | Freq: Once | INTRAVENOUS | Status: AC
  Administered 2020-08-13: 1000 mL via INTRAVENOUS

## 2020-08-13 MED ORDER — IPRATROPIUM-ALBUTEROL 0.5-2.5 (3) MG/3ML IN SOLN
3.0000 mL | Freq: Once | RESPIRATORY_TRACT | Status: AC
  Administered 2020-08-13: 3 mL via RESPIRATORY_TRACT
  Filled 2020-08-13: qty 3

## 2020-08-13 MED ORDER — KETOROLAC TROMETHAMINE 30 MG/ML IJ SOLN
15.0000 mg | Freq: Once | INTRAMUSCULAR | Status: AC
  Administered 2020-08-13: 15 mg via INTRAVENOUS
  Filled 2020-08-13: qty 1

## 2020-08-13 NOTE — ED Provider Notes (Signed)
[facility] Emergency Department Provider Note  ____________________________________________  Time seen: Approximately 11:18 PM  I have reviewed the triage vital signs and the nursing notes.   HISTORY  Chief Complaint Shortness of Breath   HPI Cesar Braun is a 30 y.o. male history of hypertension, obesity, OSA on CPAP who presents for evaluation of shortness of breath.  Patient diagnosed with Covid 10 days ago.  Started complaining of chest pain that started today.  The pain is dull, substernal, radiating to the chest, constant.  Also has had nausea and several episodes of nonbloody nonbilious emesis.  Patient is complaining of progressively worsening shortness of breath and cough.  No history of asthma, COPD, or smoking.  Patient is not vaccinated against Covid.  Patient denies any personal or family history of blood clots, recent travel immobilization, leg pain or swelling, hemoptysis, exogenous hormones.  Started to have fevers again today.   Patient reports that he has been checking his oxygen at home and has been 100%  Past Medical History:  Diagnosis Date  . Anxiety   . Hypertension   . Obesity   . Pain    llq  . Sleep apnea     Patient Active Problem List   Diagnosis Date Noted  . Anxiety 10/02/2016  . HTN (hypertension) 10/02/2016  . Obesity 10/02/2016  . Sleep apnea 10/02/2016  . Abdominal pain, LLQ (left lower quadrant) 08/10/2016    Past Surgical History:  Procedure Laterality Date  . KNEE ARTHROSCOPY Right   . TYMPANOSTOMY TUBE PLACEMENT Bilateral    very young    Prior to Admission medications   Medication Sig Start Date End Date Taking? Authorizing Provider  albuterol (VENTOLIN HFA) 108 (90 Base) MCG/ACT inhaler Inhale 2 puffs into the lungs every 6 (six) hours as needed for wheezing or shortness of breath. 08/14/20  Yes Don Perking, [place], MD  predniSONE (DELTASONE) 20 MG tablet Take 3 tablets (60 mg total) by mouth daily  with breakfast for 4 days. 08/14/20 08/18/20 Yes Tullio Chausse, [place], MD  cyclobenzaprine (FLEXERIL) 10 MG tablet Take 10 mg by mouth 3 (three) times daily. 10/31/18   [provider]  ondansetron (ZOFRAN) 4 MG tablet Take 1 tablet (4 mg total) by mouth every 8 (eight) hours as needed for nausea or vomiting. 11/16/18   Jeanmarie Plant, MD  tamsulosin (FLOMAX) 0.4 MG CAPS capsule Take 1 capsule (0.4 mg total) by mouth daily. 11/16/18   Jeanmarie Plant, MD    Allergies Patient has no known allergies.  Family History  Problem Relation Age of Onset  . Diabetes Mother   . Hypertension Father     Social History Social History   Tobacco Use  . Smoking status: Never Smoker  . Smokeless tobacco: Never Used  Vaping Use  . Vaping Use: Never used  Substance Use Topics  . Alcohol use: Yes    Alcohol/week: 7.0 standard drinks    Types: 7 Cans of beer per week  . Drug use: No    Review of Systems  Constitutional: + fever, body aches Eyes: Negative for visual changes. ENT: Negative for sore throat. Neck: No neck pain  Cardiovascular: + chest pain. Respiratory: + shortness of breath, cough Gastrointestinal: Negative for abdominal pain or diarrhea. + N/V Genitourinary: Negative for dysuria. Musculoskeletal: Negative for back pain. Skin: Negative for rash. Neurological: Negative for headaches, weakness or numbness. Psych: No SI or HI  ____________________________________________   PHYSICAL EXAM:  VITAL SIGNS: ED Triage Vitals  Enc Vitals Group     BP 08/13/20 2244 115/61     Pulse Rate 08/13/20 2244 (!) 132     Resp 08/13/20 2244 (!) 24     Temp 08/13/20 2244 99.2 F (37.3 C)     Temp src --      SpO2 08/13/20 2244 99 %     Weight 08/13/20 2246 (!) 320 lb (145.2 kg)     Height 08/13/20 2246 5\' 7"  (1.702 m)     Head Circumference --      Peak Flow --      Pain Score 08/13/20 2246 9     Pain Loc --      Pain Edu? --      Excl. in GC? --     Constitutional: Alert and  oriented, no distress.  HEENT:      Head: Normocephalic and atraumatic.         Eyes: Conjunctivae are normal. Sclera is non-icteric.       Mouth/Throat: Mucous membranes are moist.       Neck: Supple with no signs of meningismus. Cardiovascular: Tachycardic with regular rhythm Respiratory: Slightly increased work of breathing, tachypneic, satting 99% on room air, lungs are clear to auscultation with slight decreased air movement but no wheezing or crackles Gastrointestinal: Soft, non tender. Musculoskeletal: No edema, cyanosis, or erythema of extremities. Neurologic: Normal speech and language. Face is symmetric. Moving all extremities. No gross focal neurologic deficits are appreciated. Skin: Skin is warm, dry and intact. No rash noted. Psychiatric: Mood and affect are normal. Speech and behavior are normal.  ____________________________________________   LABS (all labs ordered are listed, but only abnormal results are displayed)  Labs Reviewed  BASIC METABOLIC PANEL - Abnormal; Notable for the following components:      Result Value   Glucose, Bld 138 (*)    Calcium 8.4 (*)    All other components within normal limits  CBC WITH DIFFERENTIAL/PLATELET  FIBRIN DERIVATIVES D-DIMER ([facility] ONLY)  PROCALCITONIN  CBG MONITORING, ED  TROPONIN I (HIGH SENSITIVITY)  TROPONIN I (HIGH SENSITIVITY)   ____________________________________________  EKG  ED ECG REPORT I, 08/15/20, the attending physician, personally viewed and interpreted this ECG.  Sinus tachycardia, rate of 131, incomplete right bundle branch block, no ST elevations or depressions, S1Q3T3. Unchanged from prior from 2017 ____________________________________________  RADIOLOGY  I have personally reviewed the images performed during this visit and I agree with the Radiologist's read.   Interpretation by Radiologist:  DG Chest 2 View  Result Date: 08/14/2020 CLINICAL DATA:  Chest pain, shortness of breath,  nausea, vomiting. COVID-19 10 days ago. Bronchitis diagnosis 2 days ago. Patient in mild distress. EXAM: CHEST - 2 VIEW.  Patient is rotated. COMPARISON:  Chest x-ray 11/28/2015 FINDINGS: The heart size and mediastinal contours are within normal limits. No focal consolidation. Increased interstitial markings as well as peribronchial cuffing. No pleural effusion. No pneumothorax. No acute osseous abnormality. IMPRESSION: Findings consistent with bronchitis. Electronically Signed   By: 11/30/2015 M.D.   On: 08/14/2020 00:18      ____________________________________________   PROCEDURES  Procedure(s) performed:yes .1-3 Lead EKG Interpretation Performed by: 08/16/2020, MD Authorized by: Nita Sickle, MD     Interpretation: non-specific     ECG rate assessment: tachycardic     Rhythm: sinus tachycardia     Ectopy: none     Critical Care performed:  None ____________________________________________   INITIAL IMPRESSION / ASSESSMENT AND PLAN / ED COURSE  29  y.o. male history of hypertension, obesity, OSA on CPAP who presents for evaluation of shortness of breath, cough, body aches, fever, nausea, vomiting, and now chest pain. Diagnosed with Covid 10 days ago. Unvaccinated. Mild increased work of breathing with tachypnea but no hypoxia, slightly decreased air movement bilaterally with no wheezing or crackles.  History of COPD, emphysema, asthma, or smoking.  Patient is tachycardic and slightly tachypneic with a temp of 99.2.  No hypoxia.  Ddx covid, myocarditis, pericarditis, PNA, PE, ACS, edema.  Plan for CXR, labs, d-dimer, procalcitonin.  Will place patient on telemetry for close monitoring of respiratory and cardiac status.  We will treat with duo nebs, Solu-Medrol, IV fluids, Zofran, and Toradol for body aches and chest pain.  EKG visualized by me showing sinus tachycardia with a incomplete right bundle branch block and S1Q3T3 pattern however when compared to prior  from 2017 EKG is unchanged.  Old medical records reviewed with no history of PE or DVT.  _________________________ 2:38 AM on 08/14/2020 -----------------------------------------  2 troponins negative no signs of myocarditis.  Negative D-dimer.  Normal blood work.  Procalcitonin is negative.  Chest x-ray consistent with bronchitis, visualized by me confirmed by radiology.  Patient had one brief episode where his sats were 91% while he was asleep but while he is awake and with ambulation his sats have remained 95% or above.  Will discharge home with albuterol, prednisone, monitoring of oxygen status with his pulse oximeter.  Discussed immune system boosting, close follow-up with post Covid clinic, and my standard return precautions for any signs of hypoxia or chest pain.    _____________________________________________ Please note:  Patient was evaluated in Emergency Department today for the symptoms described in the history of present illness. Patient was evaluated in the context of the global COVID-19 pandemic, which necessitated consideration that the patient might be at risk for infection with the SARS-CoV-2 virus that causes COVID-19. Institutional protocols and algorithms that pertain to the evaluation of patients at risk for COVID-19 are in a state of rapid change based on information released by regulatory bodies including the CDC and federal and state organizations. These policies and algorithms were followed during the patient's care in the ED.  Some ED evaluations and interventions may be delayed as a result of limited staffing during the pandemic.    Controlled Substance Database was reviewed by me. ____________________________________________   FINAL CLINICAL IMPRESSION(S) / ED DIAGNOSES   Final diagnoses:  Bronchitis due to COVID-19 virus      NEW MEDICATIONS STARTED DURING THIS VISIT:  ED Discharge Orders         Ordered    albuterol (VENTOLIN HFA) 108 (90 Base) MCG/ACT  inhaler  Every 6 hours PRN        08/14/20 0237    predniSONE (DELTASONE) 20 MG tablet  Daily with breakfast        08/14/20 0237           Note:  This document was prepared using Dragon voice recognition software and may include unintentional dictation errors.    Nita Sickle, MD 08/14/20 [phone]

## 2020-08-13 NOTE — ED Triage Notes (Signed)
Pt presents to ER from home via EMS with complaints of shortness of breath, nausea, vomiting, and chest pain. Pt reports he wad COVID 10 days ago and was diagnosed with bronchitis 2 days ago. Pt seems in mild  Distress, pt talks in complete sentences.

## 2020-08-14 ENCOUNTER — Emergency Department: Payer: Managed Care, Other (non HMO)

## 2020-08-14 LAB — BASIC METABOLIC PANEL
Anion gap: 12 (ref 5–15)
BUN: 14 mg/dL (ref 6–20)
CO2: 22 mmol/L (ref 22–32)
Calcium: 8.4 mg/dL — ABNORMAL LOW (ref 8.9–10.3)
Chloride: 103 mmol/L (ref 98–111)
Creatinine, Ser: 0.89 mg/dL (ref 0.61–1.24)
GFR, Estimated: >60 mL/min (ref >60)
Glucose, Bld: 138 mg/dL — ABNORMAL HIGH (ref 70–99)
Potassium: 3.9 mmol/L (ref 3.5–5.1)
Sodium: 137 mmol/L (ref 135–145)

## 2020-08-14 LAB — TROPONIN I (HIGH SENSITIVITY)
Troponin I (High Sensitivity): 4 ng/L (ref <18)
Troponin I (High Sensitivity): 3 ng/L (ref <18)

## 2020-08-14 LAB — PROCALCITONIN: Procalcitonin: <0.1 ng/mL

## 2020-08-14 LAB — FIBRIN DERIVATIVES D-DIMER (ARMC ONLY): Fibrin derivatives D-dimer (ARMC): 316.85 ng/mL (FEU) (ref 0.00–499.00)

## 2020-08-14 MED ORDER — ALBUTEROL SULFATE HFA 108 (90 BASE) MCG/ACT IN AERS: 2.0000 | INHALATION_SPRAY | Freq: Four times a day (QID) | RESPIRATORY_TRACT | 2 refills | Status: AC | PRN

## 2020-08-14 MED ORDER — ALBUTEROL SULFATE HFA 108 (90 BASE) MCG/ACT IN AERS: 2.0000 | INHALATION_SPRAY | Freq: Four times a day (QID) | RESPIRATORY_TRACT | 2 refills | Status: DC | PRN

## 2020-08-14 MED ORDER — PREDNISONE 20 MG PO TABS: 60.0000 mg | ORAL_TABLET | Freq: Every day | ORAL | 0 refills | Status: DC

## 2020-08-14 MED ORDER — PREDNISONE 20 MG PO TABS: 60.0000 mg | ORAL_TABLET | Freq: Every day | ORAL | 0 refills | Status: AC

## 2020-08-14 NOTE — Discharge Instructions (Signed)
Post- COVID Clinic  [phone]  To help boost your immune system against COVID-19, please take:  - Vitamin D3 4,000 IU/day - Vitamin C 500-1,000?mg twice a day - Quercetin 250?mg twice a day - Zinc 100?mg/day - Melatonin 10?mg before bedtime (causes drowsiness) - Aspirin 325?mg/day (unless contraindicated) - Pulse Oximeter Monitoring of oxygen saturation is recommended - check your oxygen 3 times a day if less than 90% return to the ER  These medications are all over-the-counter and do not need a prescription.   QUARANTINE INSTRUCTION  Follow these instructions at home:  Protecting others To avoid spreading the illness to other people: Quarantine in your home until you have had no cough and fever for 7 days. Household members should also be quarantine for at least 14 days after being exposed to you. Wash your hands often with soap and water. If soap and water are not available, use an alcohol-based hand sanitizer. If you have not cleaned your hands, do not touch your face. Make sure that all people in your household wash their hands well and often. Cover your nose and mouth when you cough or sneeze. Throw away used tissues. Stay home if you have any cold-like or flu-like symptoms. General instructions Go to your local pharmacy and buy a pulse oximeter (this is a machine that measures your oxygen). Check your oxygen levels at least 3 times a day. If your oxygen level is 90% or less return to the emergency room immediately Take over-the-counter and prescription medicines only as told by your health care provider. If you need medication for fever take tylenol or ibuprofen Drink enough fluid to keep your urine pale yellow. Rest at home as directed by your health care provider. Do not give aspirin to a child with the flu, because of the association with Reye's syndrome. Do not use tobacco products, including cigarettes, chewing tobacco, and e-cigarettes. If you need help quitting,  ask your health care provider. Keep all follow-up visits as told by your health care provider. This is important. How is this prevented? Avoid areas where an outbreak has been reported. Avoid large groups of people. Keep a safe distance from people who are coughing and sneezing. Do not touch your face if you have not cleaned your hands. When you are around people who are sick or might be sick, wear a mask to protect yourself. Contact a health care provider if: You have symptoms of SARS (cough, fever, chest pain, shortness of breath) that are not getting better at home. You have a fever. If you have difficulty breathing go to your local ER or call 911   

## 2020-08-14 NOTE — ED Notes (Signed)
Pt ambulated with pulse ox on. Pts O2 saturation stayed at 94% but HR went up to 130. Carolynn Serve, MD notified with verbal read back. Awaiting further orders. Will continue to monitor.

## 2023-01-20 ENCOUNTER — Other Ambulatory Visit: Payer: Self-pay

## 2023-01-20 ENCOUNTER — Encounter (HOSPITAL_COMMUNITY): Payer: Self-pay | Admitting: Emergency Medicine

## 2023-01-20 ENCOUNTER — Emergency Department (HOSPITAL_COMMUNITY)
Admission: EM | Admit: 2023-01-20 | Discharge: 2023-01-20 | Disposition: A | Discharge status: Home / Self Care | Payer: BC Managed Care – PPO | Attending: Emergency Medicine | Admitting: Emergency Medicine

## 2023-01-20 ENCOUNTER — Emergency Department (HOSPITAL_COMMUNITY): Payer: BC Managed Care – PPO

## 2023-01-20 DIAGNOSIS — R110 Nausea: Secondary | ICD-10-CM | POA: Diagnosis not present

## 2023-01-20 DIAGNOSIS — D72829 Elevated white blood cell count, unspecified: Secondary | ICD-10-CM | POA: Insufficient documentation

## 2023-01-20 DIAGNOSIS — R1010 Upper abdominal pain, unspecified: Secondary | ICD-10-CM

## 2023-01-20 DIAGNOSIS — R1011 Right upper quadrant pain: Secondary | ICD-10-CM | POA: Insufficient documentation

## 2023-01-20 LAB — LIPASE, BLOOD: Lipase: 31 U/L (ref 11–51)

## 2023-01-20 LAB — COMPREHENSIVE METABOLIC PANEL
ALT: 38 U/L (ref 0–44)
AST: 22 U/L (ref 15–41)
Albumin: 4.1 g/dL (ref 3.5–5.0)
Alkaline Phosphatase: 95 U/L (ref 38–126)
Anion gap: 8 (ref 5–15)
BUN: 11 mg/dL (ref 6–20)
CO2: 27 mmol/L (ref 22–32)
Calcium: 9.1 mg/dL (ref 8.9–10.3)
Chloride: 104 mmol/L (ref 98–111)
Creatinine, Ser: 0.82 mg/dL (ref 0.61–1.24)
GFR, Estimated: >60 mL/min (ref >60)
Glucose, Bld: 116 mg/dL — ABNORMAL HIGH (ref 70–99)
Potassium: 3.8 mmol/L (ref 3.5–5.1)
Sodium: 139 mmol/L (ref 135–145)
Total Bilirubin: 1.5 mg/dL — ABNORMAL HIGH (ref 0.3–1.2)
Total Protein: 7.9 g/dL (ref 6.5–8.1)

## 2023-01-20 LAB — CBC WITH DIFFERENTIAL/PLATELET
Abs Immature Granulocytes: 0.05 10*3/uL (ref 0.00–0.07)
Basophils Absolute: 0.1 10*3/uL (ref 0.0–0.1)
Basophils Relative: 0 %
Eosinophils Absolute: 0.2 10*3/uL (ref 0.0–0.5)
Eosinophils Relative: 1 %
HCT: 48.9 % (ref 39.0–52.0)
Hemoglobin: 16.1 g/dL (ref 13.0–17.0)
Immature Granulocytes: 0 %
Lymphocytes Relative: 23 %
Lymphs Abs: 3.3 10*3/uL (ref 0.7–4.0)
MCH: 29.5 pg (ref 26.0–34.0)
MCHC: 32.9 g/dL (ref 30.0–36.0)
MCV: 89.7 fL (ref 80.0–100.0)
Monocytes Absolute: 0.7 10*3/uL (ref 0.1–1.0)
Monocytes Relative: 5 %
Neutro Abs: 9.8 10*3/uL — ABNORMAL HIGH (ref 1.7–7.7)
Neutrophils Relative %: 71 %
Platelets: 312 10*3/uL (ref 150–400)
RBC: 5.45 MIL/uL (ref 4.22–5.81)
RDW: 13.9 % (ref 11.5–15.5)
WBC: 14.1 10*3/uL — ABNORMAL HIGH (ref 4.0–10.5)
nRBC: 0 % (ref 0.0–0.2)

## 2023-01-20 MED ORDER — IOHEXOL 300 MG/ML  SOLN
100.0000 mL | Freq: Once | INTRAMUSCULAR | Status: AC | PRN
  Administered 2023-01-20: 100 mL via INTRAVENOUS

## 2023-01-20 MED ORDER — PANTOPRAZOLE SODIUM 40 MG PO TBEC: 40.0000 mg | DELAYED_RELEASE_TABLET | Freq: Every day | ORAL | 3 refills | Status: AC

## 2023-01-20 MED ORDER — ONDANSETRON HCL 4 MG/2ML IJ SOLN
4.0000 mg | Freq: Once | INTRAMUSCULAR | Status: AC
  Administered 2023-01-20: 4 mg via INTRAVENOUS
  Filled 2023-01-20: qty 2

## 2023-01-20 NOTE — ED Triage Notes (Signed)
Pt c/o RUQ pain on and off since July 4th. Pt states pain has increased in the last 24 hrs. Pt also c/o nausea.

## 2023-01-20 NOTE — Discharge Instructions (Signed)
Try to avoid fatty and spicy foods, follow-up with your doctor as scheduled.  If you develop fever, worsening pain return to the ED.

## 2023-01-20 NOTE — ED Provider Notes (Signed)
[place] EMERGENCY DEPARTMENT AT [facility] Provider Note   CSN: [id number] Arrival date & time: 01/20/23  0441     History  Chief Complaint  Patient presents with   Abdominal Pain    Cesar Braun is a 32 y.o. male.  Patient presents to the emergency department for evaluation of abdominal pain.  Patient has been having intermittent right upper quadrant abdominal pain for the last 2 days.  He has not identified anything that makes it happen, does not seem to be related to eating.  He is now developing some nausea with the pain.  No vomiting.       Home Medications Prior to Admission medications   Medication Sig Start Date End Date Taking? Authorizing Provider  pantoprazole (PROTONIX) 40 MG tablet Take 1 tablet (40 mg total) by mouth daily. 01/20/23  Yes Madason Rauls, Canary Brim, MD  albuterol (VENTOLIN HFA) 108 (90 Base) MCG/ACT inhaler Inhale 2 puffs into the lungs every 6 (six) hours as needed for wheezing or shortness of breath. 08/14/20   Don Perking, [place], MD  cyclobenzaprine (FLEXERIL) 10 MG tablet Take 10 mg by mouth 3 (three) times daily. 10/31/18   [provider]  ondansetron (ZOFRAN) 4 MG tablet Take 1 tablet (4 mg total) by mouth every 8 (eight) hours as needed for nausea or vomiting. 11/16/18   Jeanmarie Plant, MD  tamsulosin (FLOMAX) 0.4 MG CAPS capsule Take 1 capsule (0.4 mg total) by mouth daily. 11/16/18   Jeanmarie Plant, MD      Allergies    Patient has no known allergies.    Review of Systems   Review of Systems  Physical Exam Updated Vital Signs BP 127/71   Pulse 90   Temp 98.1 F (36.7 C) (Oral)   Resp (!) 26   Ht 5\' 7"  (1.702 m)   Wt (!) 145.2 kg   SpO2 95%   BMI 50.14 kg/m  Physical Exam Vitals and nursing note reviewed.  Constitutional:      General: He is not in acute distress.    Appearance: He is well-developed.  HENT:     Head: Normocephalic and atraumatic.     Mouth/Throat:     Mouth: Mucous membranes are moist.   Eyes:     General: Vision grossly intact. Gaze aligned appropriately.     Extraocular Movements: Extraocular movements intact.     Conjunctiva/sclera: Conjunctivae normal.  Cardiovascular:     Rate and Rhythm: Normal rate and regular rhythm.     Pulses: Normal pulses.     Heart sounds: Normal heart sounds, S1 normal and S2 normal. No murmur heard.    No friction rub. No gallop.  Pulmonary:     Effort: Pulmonary effort is normal. No respiratory distress.     Breath sounds: Normal breath sounds.  Abdominal:     Palpations: Abdomen is soft.     Tenderness: There is abdominal tenderness in the right upper quadrant. There is no guarding or rebound.     Hernia: No hernia is present.  Musculoskeletal:        General: No swelling.     Cervical back: Full passive range of motion without pain, normal range of motion and neck supple. No pain with movement, spinous process tenderness or muscular tenderness. Normal range of motion.     Right lower leg: No edema.     Left lower leg: No edema.  Skin:    General: Skin is warm and dry.  Capillary Refill: Capillary refill takes less than 2 seconds.     Findings: No ecchymosis, erythema, lesion or wound.  Neurological:     Mental Status: He is alert and oriented to person, place, and time.     GCS: GCS eye subscore is 4. GCS verbal subscore is 5. GCS motor subscore is 6.     Cranial Nerves: Cranial nerves 2-12 are intact.     Sensory: Sensation is intact.     Motor: Motor function is intact. No weakness or abnormal muscle tone.     Coordination: Coordination is intact.  Psychiatric:        Mood and Affect: Mood normal.        Speech: Speech normal.        Behavior: Behavior normal.     ED Results / Procedures / Treatments   Labs (all labs ordered are listed, but only abnormal results are displayed) Labs Reviewed  CBC WITH DIFFERENTIAL/PLATELET - Abnormal; Notable for the following components:      Result Value   WBC 14.1 (*)    Neutro  Abs 9.8 (*)    All other components within normal limits  COMPREHENSIVE METABOLIC PANEL - Abnormal; Notable for the following components:   Glucose, Bld 116 (*)    Total Bilirubin 1.5 (*)    All other components within normal limits  LIPASE, BLOOD    EKG None  Radiology CT ABDOMEN PELVIS W CONTRAST  Result Date: 01/20/2023 CLINICAL DATA:  Acute, nonlocalized abdominal pain. Symptoms in the right upper quadrant on off since July 4th, increased. EXAM: CT ABDOMEN AND PELVIS WITH CONTRAST TECHNIQUE: Multidetector CT imaging of the abdomen and pelvis was performed using the standard protocol following bolus administration of intravenous contrast. RADIATION DOSE REDUCTION: This exam was performed according to the departmental dose-optimization program which includes automated exposure control, adjustment of the mA and/or kV according to patient size and/or use of iterative reconstruction technique. CONTRAST:  OMNIPAQUE IOHEXOL 300 MG/ML  SOLN COMPARISON:  Abdominal CT 11/16/2018 FINDINGS: Lower chest:  No contributory findings. Hepatobiliary: No focal liver abnormality.No evidence of biliary obstruction or stone. Pancreas: Unremarkable. Spleen: Unremarkable. Adrenals/Urinary Tract: Negative adrenals. No hydronephrosis or ureteral stone. 2 3 mm calculi in the left kidney. Unremarkable bladder. Stomach/Bowel:  No obstruction. No appendicitis. Vascular/Lymphatic: No acute vascular abnormality. No mass or adenopathy. Reproductive:No pathologic findings. Other: No ascites or pneumoperitoneum. Musculoskeletal: No acute abnormalities. Endplate spurring with probable underlying protrusion at the right L5-S1 foramen. IMPRESSION: No acute finding. Nonobstructing left renal calculi. Electronically Signed   By: Tiburcio Pea M.D.   On: 01/20/2023 05:53    Procedures Procedures    Medications Ordered in ED Medications  ondansetron (ZOFRAN) injection 4 mg (4 mg Intravenous Given 01/20/23 0457)  iohexol  (OMNIPAQUE) 300 MG/ML solution 100 mL (100 mLs Intravenous Contrast Given 01/20/23 0536)    ED Course/ Medical Decision Making/ A&P                             Medical Decision Making Amount and/or Complexity of Data Reviewed Labs: ordered. Radiology: ordered.  Risk Prescription drug management.   Differential Diagnosis considered includes, but not limited to: Cholelithiasis; cholecystitis; cholangitis; bowel obstruction; esophagitis; gastritis; peptic ulcer disease; pancreatitis; cardiac.   Presents to the emergency department for evaluation of upper abdominal discomfort.  Patient reports that symptoms have been on and off for the last couple of days.  Symptoms has not been  related to eating.  No history of GERD, does still have a gallbladder.  Patient with right upper quadrant tenderness but no Murphy sign, no guarding, no rebound.  Lab work unremarkable other than leukocytosis.  Patient therefore underwent CT scan.  No acute abnormality.  Discussed the possibility of waiting until later today when ultrasound is available to do a right upper quadrant ultrasound versus trying to schedule one for Monday versus follow-up with his doctor.  He reports that he has follow-up scheduled with his doctor and would prefer to just see his PCP.  Given instructions on return for worsening symptoms.        Final Clinical Impression(s) / ED Diagnoses Final diagnoses:  Pain of upper abdomen    Rx / DC Orders ED Discharge Orders          Ordered    pantoprazole (PROTONIX) 40 MG tablet  Daily        01/20/23 0658              Gilda Crease, MD 01/20/23 [phone]

## 2023-01-23 ENCOUNTER — Ambulatory Visit
Admission: RE | Admit: 2023-01-23 | Discharge: 2023-01-23 | Disposition: A | Discharge status: Home / Self Care | Payer: BC Managed Care – PPO | Source: Ambulatory Visit | Attending: Physician Assistant | Admitting: Physician Assistant

## 2023-01-23 ENCOUNTER — Other Ambulatory Visit: Payer: Self-pay

## 2023-01-23 ENCOUNTER — Emergency Department: Payer: BC Managed Care – PPO

## 2023-01-23 ENCOUNTER — Emergency Department
Admission: EM | Admit: 2023-01-23 | Discharge: 2023-01-23 | Disposition: A | Discharge status: Home / Self Care | Payer: BC Managed Care – PPO | Attending: Emergency Medicine | Admitting: Emergency Medicine

## 2023-01-23 ENCOUNTER — Other Ambulatory Visit: Payer: Self-pay | Admitting: Physician Assistant

## 2023-01-23 DIAGNOSIS — R8289 Other abnormal findings on cytological and histological examination of urine: Secondary | ICD-10-CM | POA: Diagnosis not present

## 2023-01-23 DIAGNOSIS — R110 Nausea: Secondary | ICD-10-CM | POA: Diagnosis not present

## 2023-01-23 DIAGNOSIS — R1011 Right upper quadrant pain: Secondary | ICD-10-CM | POA: Insufficient documentation

## 2023-01-23 DIAGNOSIS — D72829 Elevated white blood cell count, unspecified: Secondary | ICD-10-CM | POA: Diagnosis not present

## 2023-01-23 DIAGNOSIS — I10 Essential (primary) hypertension: Secondary | ICD-10-CM | POA: Insufficient documentation

## 2023-01-23 LAB — URINALYSIS, ROUTINE W REFLEX MICROSCOPIC
Bilirubin Urine: NEGATIVE
Glucose, UA: NEGATIVE mg/dL
Hgb urine dipstick: NEGATIVE
Ketones, ur: 20 mg/dL — AB
Leukocytes,Ua: NEGATIVE
Nitrite: NEGATIVE
Protein, ur: 30 mg/dL — AB
Specific Gravity, Urine: 1.027 (ref 1.005–1.030)
pH: 5 (ref 5.0–8.0)

## 2023-01-23 LAB — CBC
HCT: 52.4 % — ABNORMAL HIGH (ref 39.0–52.0)
Hemoglobin: 17.1 g/dL — ABNORMAL HIGH (ref 13.0–17.0)
MCH: 29 pg (ref 26.0–34.0)
MCHC: 32.6 g/dL (ref 30.0–36.0)
MCV: 89 fL (ref 80.0–100.0)
Platelets: 324 10*3/uL (ref 150–400)
RBC: 5.89 MIL/uL — ABNORMAL HIGH (ref 4.22–5.81)
RDW: 13.5 % (ref 11.5–15.5)
WBC: 12.7 10*3/uL — ABNORMAL HIGH (ref 4.0–10.5)
nRBC: 0 % (ref 0.0–0.2)

## 2023-01-23 LAB — COMPREHENSIVE METABOLIC PANEL
ALT: 41 U/L (ref 0–44)
AST: 26 U/L (ref 15–41)
Albumin: 4.6 g/dL (ref 3.5–5.0)
Alkaline Phosphatase: 102 U/L (ref 38–126)
Anion gap: 11 (ref 5–15)
BUN: 8 mg/dL (ref 6–20)
CO2: 22 mmol/L (ref 22–32)
Calcium: 9.2 mg/dL (ref 8.9–10.3)
Chloride: 104 mmol/L (ref 98–111)
Creatinine, Ser: 0.78 mg/dL (ref 0.61–1.24)
GFR, Estimated: >60 mL/min (ref >60)
Glucose, Bld: 97 mg/dL (ref 70–99)
Potassium: 4.1 mmol/L (ref 3.5–5.1)
Sodium: 137 mmol/L (ref 135–145)
Total Bilirubin: 1.9 mg/dL — ABNORMAL HIGH (ref 0.3–1.2)
Total Protein: 8.2 g/dL — ABNORMAL HIGH (ref 6.5–8.1)

## 2023-01-23 LAB — LIPASE, BLOOD: Lipase: 29 U/L (ref 11–51)

## 2023-01-23 MED ORDER — CEPHALEXIN 500 MG PO CAPS: 500.0000 mg | ORAL_CAPSULE | Freq: Four times a day (QID) | ORAL | 0 refills | Status: DC

## 2023-01-23 MED ORDER — CEPHALEXIN 500 MG PO CAPS: 500.0000 mg | ORAL_CAPSULE | Freq: Four times a day (QID) | ORAL | 0 refills | Status: AC

## 2023-01-23 MED ORDER — IOHEXOL 350 MG/ML SOLN
100.0000 mL | Freq: Once | INTRAVENOUS | Status: AC | PRN
  Administered 2023-01-23: 100 mL via INTRAVENOUS

## 2023-01-23 MED ORDER — ONDANSETRON 4 MG PO TBDP: 4.0000 mg | ORAL_TABLET | Freq: Three times a day (TID) | ORAL | 0 refills | Status: DC | PRN

## 2023-01-23 MED ORDER — ONDANSETRON 4 MG PO TBDP: 4.0000 mg | ORAL_TABLET | Freq: Three times a day (TID) | ORAL | 0 refills | Status: AC | PRN

## 2023-01-23 NOTE — ED Triage Notes (Signed)
Pt to ED for right sided abd pain for a couple days with nausea. Was seen at ER and [facility]. Negative [place].

## 2023-01-23 NOTE — ED Provider Triage Note (Signed)
Emergency Medicine Provider Triage Evaluation Note  Cesar Braun , a 32 y.o. male  was evaluated in triage.  Pt complains of ruq pain with nausea and diaphoresis. He was evaluated at [facility] on 01/18/23 with negative workup including CT. PCP got [place] RUQ today and was negative. Pain progressively worsening.  Physical Exam  BP (!) 162/116   Pulse (!) 107   Temp 98.9 F (37.2 C)   Resp 20   Ht 5\' 7"  (1.702 m)   Wt (!) 145 kg   SpO2 97%   BMI 50.07 kg/m  Gen:   Awake, no distress   Resp:  Normal effort  MSK:   Moves extremities without difficulty  Other:  Right upper/mid abdomen tender to palpation.  Medical Decision Making  Medically screening exam initiated at 2:13 PM.  Appropriate orders placed.  DAISHAWN LAUF was informed that the remainder of the evaluation will be completed by another provider, this initial triage assessment does not replace that evaluation, and the importance of remaining in the ED until their evaluation is complete.     Chinita Pester, FNP 01/23/23 1429

## 2023-01-23 NOTE — ED Provider Notes (Signed)
[facility] Provider Note  Patient Contact: 3:56 PM (approximate)   History   Abdominal Pain   HPI  Cesar Braun is a 32 y.o. male with a history of anxiety and hypertension, presents to the emergency department with right upper abdominal pain that radiates to the right shoulder with associated nausea but no vomiting.  Patient states that he has been symptomatic for over a week and has had a prior emergency department evaluation at [facility].  Patient states that he has had a right upper quadrant ultrasound which was reassuring and a normal CT abdomen pelvis.  Patient states that he does take medication for weight loss but states that he has been on this medicine for 3 months without side effects and has had no recent dosage adjustments.  He denies associated shortness of breath or chest pain.  No chest tightness.  He denies fever.  Patient reports that he has to force himself to eat.  Patient denies dysuria, hematuria or increased urinary frequency.   Physical Exam   Triage Vital Signs: ED Triage Vitals  Enc Vitals Group     BP 01/23/23 1408 (!) 162/116     Pulse Rate 01/23/23 1408 (!) 107     Resp 01/23/23 1408 20     Temp 01/23/23 1411 98.9 F (37.2 C)     Temp src --      SpO2 01/23/23 1408 97 %     Weight 01/23/23 1409 (!) 319 lb 10.7 oz (145 kg)     Height 01/23/23 1409 5\' 7"  (1.702 m)     Head Circumference --      Peak Flow --      Pain Score 01/23/23 1409 5     Pain Loc --      Pain Edu? --      Excl. in GC? --     Most recent vital signs: Vitals:   01/23/23 1408 01/23/23 1411  BP: (!) 162/116   Pulse: (!) 107   Resp: 20   Temp:  98.9 F (37.2 C)  SpO2: 97%      General: Alert and in no acute distress. Eyes:  PERRL. EOMI. Head: No acute traumatic findings ENT:      Nose: No congestion/rhinnorhea.      Mouth/Throat: Mucous membranes are moist. Neck: No stridor. No cervical spine tenderness to palpation. Cardiovascular:   Good peripheral perfusion Respiratory: Normal respiratory effort without tachypnea or retractions. Lungs CTAB. Good air entry to the bases with no decreased or absent breath sounds. Gastrointestinal: Bowel sounds 4 quadrants. Soft and nontender to palpation. No guarding or rigidity. No palpable masses. No distention. No CVA tenderness. Musculoskeletal: Full range of motion to all extremities.  Neurologic:  No gross focal neurologic deficits are appreciated.  Skin:   No rash noted   ED Results / Procedures / Treatments   Labs (all labs ordered are listed, but only abnormal results are displayed) Labs Reviewed  COMPREHENSIVE METABOLIC PANEL - Abnormal; Notable for the following components:      Result Value   Total Protein 8.2 (*)    Total Bilirubin 1.9 (*)    All other components within normal limits  CBC - Abnormal; Notable for the following components:   WBC 12.7 (*)    RBC 5.89 (*)    Hemoglobin 17.1 (*)    HCT 52.4 (*)    All other components within normal limits  URINALYSIS, ROUTINE W REFLEX MICROSCOPIC - Abnormal; Notable for  the following components:   Color, Urine AMBER (*)    APPearance CLOUDY (*)    Ketones, ur 20 (*)    Protein, ur 30 (*)    Bacteria, UA RARE (*)    All other components within normal limits  LIPASE, BLOOD        RADIOLOGY  I personally viewed and evaluated these images as part of my medical decision making, as well as reviewing the written report by the radiologist.  ED Provider Interpretation: CT angio chest and CT angio abdomen unremarkable   PROCEDURES:  Critical Care performed: No  Procedures   MEDICATIONS ORDERED IN ED: Medications  iohexol (OMNIPAQUE) 350 MG/ML injection 100 mL (100 mLs Intravenous Contrast Given 01/23/23 1643)     IMPRESSION / MDM / ASSESSMENT AND PLAN / ED COURSE  I reviewed the triage vital signs and the nursing notes.                              Assessment and plan Abdominal pain 32 year old male  presents to the emergency department with right upper quadrant abdominal pain that seems to radiate to his back for the past week with associated nausea but no vomiting.  Patient was hypertensive and tachycardic at triage but vital signs otherwise reassuring.  On exam, patient is alert and nontoxic-appearing with no guarding to palpation of the right upper quadrant.  Differential diagnosis includes PE, mesenteric ischemia, cholecystitis, cholelithiasis, pyelonephritis, UTI....  Will obtain CT angio chest, abdomen and pelvis and will reassess.  CBC indicates mildly elevated white blood cell count but otherwise unremarkable.  Lipase within range.  CMP within reference range aside from elevated T. bili.  Urinalysis indicates rare bacteria and protein and ketonuria but otherwise not suggestive of UTI.   CT angio chest and CT angio abdomen unremarkable for acute abnormality.  Given rare bacteria and only 6-10 white blood cells, I am not overly concerned for UTI.  Despite this, will cover patient for Keflex for UTI and Zofran for nausea.  Patient feels comfortable with this plan.  Return precautions were given to return with new or worsening symptoms.  FINAL CLINICAL IMPRESSION(S) / ED DIAGNOSES   Final diagnoses:  Right upper quadrant abdominal pain     Rx / DC Orders   ED Discharge Orders          Ordered    ondansetron (ZOFRAN-ODT) 4 MG disintegrating tablet  Every 8 hours PRN,   Status:  Discontinued        01/23/23 1743    cephALEXin (KEFLEX) 500 MG capsule  4 times daily,   Status:  Discontinued        01/23/23 1743    cephALEXin (KEFLEX) 500 MG capsule  4 times daily        01/23/23 1748    ondansetron (ZOFRAN-ODT) 4 MG disintegrating tablet  Every 8 hours PRN        01/23/23 1748             Note:  This document was prepared using Dragon voice recognition software and may include unintentional dictation errors.   Pia Mau [place], PA-C 01/23/23 1749    Pilar Jarvis,  MD 01/23/23 [phone]

## 2023-01-23 NOTE — Discharge Instructions (Addendum)
You can take Keflex every six hours for the next seven days. You can take Zofran every eight hours for nausea.

## 2023-01-30 ENCOUNTER — Other Ambulatory Visit: Payer: Self-pay | Admitting: Family Medicine

## 2023-01-30 DIAGNOSIS — R1011 Right upper quadrant pain: Secondary | ICD-10-CM

## 2023-02-01 ENCOUNTER — Encounter: Payer: Self-pay | Admitting: Orthopedic Surgery

## 2023-02-07 ENCOUNTER — Encounter
Admission: RE | Admit: 2023-02-07 | Discharge: 2023-02-07 | Disposition: A | Discharge status: Home / Self Care | Payer: BC Managed Care – PPO | Source: Ambulatory Visit | Attending: Family Medicine | Admitting: Family Medicine

## 2023-02-07 DIAGNOSIS — R1011 Right upper quadrant pain: Secondary | ICD-10-CM | POA: Diagnosis not present

## 2023-02-07 MED ORDER — TECHNETIUM TC 99M MEBROFENIN IV KIT
5.0000 | PACK | Freq: Once | INTRAVENOUS | Status: AC
  Administered 2023-02-07: 5.28 via INTRAVENOUS

## 2024-07-02 ENCOUNTER — Emergency Department (HOSPITAL_COMMUNITY)
Admission: EM | Admit: 2024-07-02 | Discharge: 2024-07-02 | Disposition: A | Discharge status: Home / Self Care | Attending: Emergency Medicine | Admitting: Emergency Medicine

## 2024-07-02 ENCOUNTER — Other Ambulatory Visit: Payer: Self-pay

## 2024-07-02 ENCOUNTER — Emergency Department (HOSPITAL_COMMUNITY)

## 2024-07-02 ENCOUNTER — Encounter (HOSPITAL_COMMUNITY): Payer: Self-pay

## 2024-07-02 DIAGNOSIS — R079 Chest pain, unspecified: Secondary | ICD-10-CM | POA: Diagnosis present

## 2024-07-02 DIAGNOSIS — R0789 Other chest pain: Secondary | ICD-10-CM | POA: Insufficient documentation

## 2024-07-02 LAB — TROPONIN T, HIGH SENSITIVITY
Troponin T High Sensitivity: <15 ng/L (ref 0–19)
Troponin T High Sensitivity: <15 ng/L (ref 0–19)

## 2024-07-02 LAB — CBC
HCT: 46.8 % (ref 39.0–52.0)
Hemoglobin: 15.2 g/dL (ref 13.0–17.0)
MCH: 29.9 pg (ref 26.0–34.0)
MCHC: 32.5 g/dL (ref 30.0–36.0)
MCV: 92.1 fL (ref 80.0–100.0)
Platelets: 257 K/uL (ref 150–400)
RBC: 5.08 MIL/uL (ref 4.22–5.81)
RDW: 14.2 % (ref 11.5–15.5)
WBC: 12.6 K/uL — ABNORMAL HIGH (ref 4.0–10.5)
nRBC: 0 % (ref 0.0–0.2)

## 2024-07-02 LAB — BASIC METABOLIC PANEL WITH GFR
Anion gap: 13 (ref 5–15)
BUN: 9 mg/dL (ref 6–20)
CO2: 24 mmol/L (ref 22–32)
Calcium: 9.1 mg/dL (ref 8.9–10.3)
Chloride: 104 mmol/L (ref 98–111)
Creatinine, Ser: 0.87 mg/dL (ref 0.61–1.24)
GFR, Estimated: >60 mL/min (ref >60)
Glucose, Bld: 81 mg/dL (ref 70–99)
Potassium: 4 mmol/L (ref 3.5–5.1)
Sodium: 141 mmol/L (ref 135–145)

## 2024-07-02 NOTE — ED Provider Notes (Signed)
 [place] [facility]ENC[facility] DEPARTMENT AT [facility] Provider Note   CSN: [id number] Arrival date & time: 07/02/24  1254     Patient presents with: Chest Pain   Cesar Braun is a 33 y.o. male.   HPI Pt arrives via [facility] from work with c/o gradual onset of CP with radiation into left arm. Staff reports pt was pale, clammy, and belching at time of event. Pt given 324 Asprin PTA. VS with EMS 148/96 113 HR 99% RA 188 CBG. Pt also reports recently being started on testosterone and having number rechecked with them coming back as way to high with medication adjustment in the last week.   Patient denies current pain, he and I spoke about the above nurse triage note, he corroborates. Patient history also provided by EMS colleagues who arrived with the patient in transport.    Prior to Admission medications  Medication Sig Start Date End Date Taking? Authorizing Provider  albuterol  (VENTOLIN  HFA) 108 (90 Base) MCG/ACT inhaler Inhale 2 puffs into the lungs every 6 (six) hours as needed for wheezing or shortness of breath. 08/14/20   Edelmiro, [place], MD  cyclobenzaprine (FLEXERIL) 10 MG tablet Take 10 mg by mouth 3 (three) times daily. 10/31/18   [provider]  ondansetron  (ZOFRAN ) 4 MG tablet Take 1 tablet (4 mg total) by mouth every 8 (eight) hours as needed for nausea or vomiting. 11/16/18   Harlee Lynwood LABOR, MD  pantoprazole  (PROTONIX ) 40 MG tablet Take 1 tablet (40 mg total) by mouth daily. 01/20/23   Haze Lonni PARAS, MD  tamsulosin  (FLOMAX ) 0.4 MG CAPS capsule Take 1 capsule (0.4 mg total) by mouth daily. 11/16/18   Harlee Lynwood LABOR, MD    Allergies: Patient has no known allergies.    Review of Systems  Updated Vital Signs BP 124/89   Pulse 91   Temp 98.6 F (37 C) (Oral)   Resp 19   Ht 1.702 m (5' 7)   Wt (!) 140.6 kg   SpO2 95%   BMI 48.55 kg/m   Physical Exam Vitals and nursing note reviewed.  Constitutional:      General: He is not in  acute distress.    Appearance: He is well-developed. He is obese.  HENT:     Head: Normocephalic and atraumatic.  Eyes:     Conjunctiva/sclera: Conjunctivae normal.  Cardiovascular:     Rate and Rhythm: Normal rate and regular rhythm.  Pulmonary:     Effort: Pulmonary effort is normal. No respiratory distress.     Breath sounds: No stridor.  Abdominal:     General: There is no distension.  Skin:    General: Skin is warm and dry.  Neurological:     Mental Status: He is alert and oriented to person, place, and time.     (all labs ordered are listed, but only abnormal results are displayed) Labs Reviewed  CBC - Abnormal; Notable for the following components:      Result Value   WBC 12.6 (*)    All other components within normal limits  BASIC METABOLIC PANEL WITH GFR  TROPONIN T, HIGH SENSITIVITY  TROPONIN T, HIGH SENSITIVITY    EKG: EKG Interpretation Date/Time:  Wednesday July 02 2024 13:11:00 EST Ventricular Rate:  101 PR Interval:  150 QRS Duration:  110 QT Interval:  343 QTC Calculation: 445 R Axis:   6  Text Interpretation: Sinus tachycardia Low voltage, precordial leads RSR' in V1 or V2, right VCD or  RVH Confirmed by Garrick Charleston [id number]) on 07/02/2024 2:17:01 PM  Radiology: [facility] Chest Port 1 View Result Date: 07/02/2024 EXAM: 1 VIEW(S) XRAY OF THE CHEST 07/02/2024 01:32:00 PM COMPARISON: None available. CLINICAL HISTORY: Chest pain FINDINGS: LUNGS AND PLEURA: Lung volumes and basilar atelectasis. No pleural effusion. No pneumothorax. HEART AND MEDIASTINUM: No acute abnormality of the cardiac and mediastinal silhouettes. BONES AND SOFT TISSUES: No acute osseous abnormality. IMPRESSION: 1. No acute findings. 2. Basilar atelectasis. Electronically signed by: Norleen Boxer MD 07/02/2024 02:13 PM EST RP Workstation: [id number]     Procedures   Medications Ordered in the ED - No data to display                                  Medical Decision Making Adult  male with hypertension, obesity presents with chest pain.  Patient is awake, alert, hemodynamically unremarkable, EMS rhythm strip reviewed, nonischemic. Initial vitals here are reassuring, but given his risk profile ACS, gastroesophageal, or medication effects all considered. Cardiac 95 sinus normal pulse ox 98% room air normal  Amount and/or Complexity of Data Reviewed Independent Historian: EMS Labs: ordered. Decision-making details documented in ED Course. Radiology: ordered and independent interpretation performed. Decision-making details documented in ED Course. ECG/medicine tests: ordered and independent interpretation performed. Decision-making details documented in ED Course.  Risk Prescription drug management. Decision regarding hospitalization.   2:40 PM On repeat exam patient awake, alert, in no distress.  Initial evaluation reassuring, EKG reassuring, no evidence for ischemia.  Chest x-ray without acute abnormality. Patient with trivial leukocytosis, may be reactive.  Chemistry panel pending.  3:28 PM On repeat exam patient in no distress, second troponin pending, with normal result patient may be appropriate for discharge, as he is asymptomatic, vitals are stable, labs thus far reassuring aside from mild leukocytosis as above.    Final diagnoses:  Atypical chest pain     Garrick Charleston, MD 07/02/24 1529

## 2024-07-02 NOTE — ED Provider Notes (Signed)
 Patient second troponin is negative.  Low suspicion for ACS.  Will discharge home to follow-up with PCP as per prior plan.   Freddi Hamilton, MD 07/02/24 [phone]

## 2024-07-02 NOTE — Discharge Instructions (Addendum)
Your evaluation today has been largely reassuring.  But, it is important that you monitor your condition carefully, and do not hesitate to return to the ED if you develop new, or concerning changes in your condition. ° °Otherwise, please follow-up with your physician for appropriate ongoing care. ° °

## 2024-07-02 NOTE — ED Notes (Signed)
 Pt at work in office.c/o sudden dull aching pain in mid chest down left arm. Witnesses say he was clammy but pt denies sweating. At that time the pain was a 6. States now has the same pain but rating 4 at this time. Denies n/v/d/dizziness/shob/sweating. Pt a/o. Non diaphoretic. Nad.

## 2024-07-02 NOTE — ED Triage Notes (Signed)
 Pt arrives via [facility] from work with c/o gradual onset of CP with radiation into left arm. Staff reports pt was pale, clammy, and belching at time of event. Pt given 324 Asprin PTA. VS with EMS 148/96 113 HR 99% RA 188 CBG. Pt also reports recently being started on testosterone and having number rechecked with them coming back as way to high with medication adjustment in the last week.
